# Patient Record
Sex: Female | Born: 1965 | Race: Black or African American | Hispanic: No | Marital: Married | Smoking: Current every day smoker
Health system: Southern US, Community
[De-identification: ages and names within clinical notes are randomized; demographics above are authoritative.]

## PROBLEM LIST (undated history)

## (undated) DIAGNOSIS — E119 Type 2 diabetes mellitus without complications: Secondary | ICD-10-CM

## (undated) DIAGNOSIS — G5600 Carpal tunnel syndrome, unspecified upper limb: Secondary | ICD-10-CM

## (undated) DIAGNOSIS — G4730 Sleep apnea, unspecified: Secondary | ICD-10-CM

## (undated) DIAGNOSIS — F329 Major depressive disorder, single episode, unspecified: Secondary | ICD-10-CM

## (undated) DIAGNOSIS — E669 Obesity, unspecified: Secondary | ICD-10-CM

## (undated) DIAGNOSIS — I10 Essential (primary) hypertension: Secondary | ICD-10-CM

## (undated) DIAGNOSIS — F32A Depression, unspecified: Secondary | ICD-10-CM

## (undated) HISTORY — PX: LAPAROSCOPIC UNILATERAL SALPINGO OOPHERECTOMY: SHX5935

---

## 2006-05-12 ENCOUNTER — Ambulatory Visit: Payer: Self-pay | Admitting: General Practice

## 2006-07-05 ENCOUNTER — Ambulatory Visit: Payer: Self-pay | Admitting: Internal Medicine

## 2006-12-06 ENCOUNTER — Ambulatory Visit: Payer: Self-pay | Admitting: Internal Medicine

## 2007-02-07 ENCOUNTER — Other Ambulatory Visit: Payer: Self-pay

## 2007-02-07 ENCOUNTER — Emergency Department: Payer: Self-pay | Admitting: Emergency Medicine

## 2007-04-17 ENCOUNTER — Ambulatory Visit: Payer: Self-pay | Admitting: Internal Medicine

## 2007-04-29 ENCOUNTER — Ambulatory Visit: Payer: Self-pay | Admitting: Family Medicine

## 2007-12-10 ENCOUNTER — Ambulatory Visit: Payer: Self-pay | Admitting: Internal Medicine

## 2008-03-28 ENCOUNTER — Ambulatory Visit: Payer: Self-pay | Admitting: Family Medicine

## 2009-09-20 ENCOUNTER — Emergency Department: Payer: Self-pay | Admitting: Emergency Medicine

## 2009-09-22 ENCOUNTER — Emergency Department: Payer: Self-pay | Admitting: Emergency Medicine

## 2010-09-29 DIAGNOSIS — L732 Hidradenitis suppurativa: Secondary | ICD-10-CM | POA: Insufficient documentation

## 2010-11-02 DIAGNOSIS — G5603 Carpal tunnel syndrome, bilateral upper limbs: Secondary | ICD-10-CM | POA: Insufficient documentation

## 2011-04-27 DIAGNOSIS — H401190 Primary open-angle glaucoma, unspecified eye, stage unspecified: Secondary | ICD-10-CM | POA: Insufficient documentation

## 2012-08-04 ENCOUNTER — Ambulatory Visit: Payer: Self-pay | Admitting: Family Medicine

## 2012-08-04 LAB — URINALYSIS, COMPLETE
Glucose,UR: 1000 mg/dL (ref 0–75)
Ketone: NEGATIVE
Leukocyte Esterase: NEGATIVE
Ph: 5 (ref 4.5–8.0)
Specific Gravity: 1.025 (ref 1.003–1.030)

## 2012-08-04 LAB — CBC WITH DIFFERENTIAL/PLATELET
Basophil #: 0.1 10*3/uL (ref 0.0–0.1)
Eosinophil #: 0.1 10*3/uL (ref 0.0–0.7)
HCT: 38.5 % (ref 35.0–47.0)
Lymphocyte #: 2.3 10*3/uL (ref 1.0–3.6)
Lymphocyte %: 32.8 %
Monocyte #: 0.3 x10 3/mm (ref 0.2–0.9)
Monocyte %: 4.7 %
Neutrophil %: 60 %
Platelet: 224 10*3/uL (ref 150–440)
WBC: 6.9 10*3/uL (ref 3.6–11.0)

## 2012-08-04 LAB — COMPREHENSIVE METABOLIC PANEL
Albumin: 3.6 g/dL (ref 3.4–5.0)
Alkaline Phosphatase: 77 U/L (ref 50–136)
Bilirubin,Total: 0.1 mg/dL — ABNORMAL LOW (ref 0.2–1.0)
Chloride: 98 mmol/L (ref 98–107)
Co2: 29 mmol/L (ref 21–32)
EGFR (Non-African Amer.): >60
Glucose: 247 mg/dL — ABNORMAL HIGH (ref 65–99)
Osmolality: 273 (ref 275–301)
SGOT(AST): 13 U/L — ABNORMAL LOW (ref 15–37)
Sodium: 133 mmol/L — ABNORMAL LOW (ref 136–145)

## 2012-08-04 LAB — MONONUCLEOSIS SCREEN: Mono Test: NEGATIVE

## 2012-10-15 DIAGNOSIS — I10 Essential (primary) hypertension: Secondary | ICD-10-CM | POA: Insufficient documentation

## 2013-02-20 DIAGNOSIS — G4733 Obstructive sleep apnea (adult) (pediatric): Secondary | ICD-10-CM | POA: Insufficient documentation

## 2013-03-27 DIAGNOSIS — M79609 Pain in unspecified limb: Secondary | ICD-10-CM | POA: Insufficient documentation

## 2013-04-17 DIAGNOSIS — Z720 Tobacco use: Secondary | ICD-10-CM | POA: Insufficient documentation

## 2013-09-24 DIAGNOSIS — F32A Depression, unspecified: Secondary | ICD-10-CM | POA: Insufficient documentation

## 2013-09-24 DIAGNOSIS — F329 Major depressive disorder, single episode, unspecified: Secondary | ICD-10-CM | POA: Insufficient documentation

## 2013-12-03 DIAGNOSIS — E1143 Type 2 diabetes mellitus with diabetic autonomic (poly)neuropathy: Secondary | ICD-10-CM | POA: Insufficient documentation

## 2014-11-03 ENCOUNTER — Ambulatory Visit
Admission: EM | Admit: 2014-11-03 | Discharge: 2014-11-03 | Disposition: A | Discharge status: Home / Self Care | Payer: Medicare PPO | Attending: Family Medicine | Admitting: Family Medicine

## 2014-11-03 DIAGNOSIS — N951 Menopausal and female climacteric states: Secondary | ICD-10-CM

## 2014-11-03 DIAGNOSIS — H6593 Unspecified nonsuppurative otitis media, bilateral: Secondary | ICD-10-CM

## 2014-11-03 DIAGNOSIS — M62838 Other muscle spasm: Secondary | ICD-10-CM | POA: Diagnosis not present

## 2014-11-03 DIAGNOSIS — J0100 Acute maxillary sinusitis, unspecified: Secondary | ICD-10-CM | POA: Diagnosis not present

## 2014-11-03 DIAGNOSIS — R609 Edema, unspecified: Secondary | ICD-10-CM

## 2014-11-03 DIAGNOSIS — J209 Acute bronchitis, unspecified: Secondary | ICD-10-CM

## 2014-11-03 HISTORY — DX: Type 2 diabetes mellitus without complications: E11.9

## 2014-11-03 HISTORY — DX: Carpal tunnel syndrome, unspecified upper limb: G56.00

## 2014-11-03 HISTORY — DX: Major depressive disorder, single episode, unspecified: F32.9

## 2014-11-03 HISTORY — DX: Sleep apnea, unspecified: G47.30

## 2014-11-03 HISTORY — DX: Obesity, unspecified: E66.9

## 2014-11-03 HISTORY — DX: Essential (primary) hypertension: I10

## 2014-11-03 HISTORY — DX: Depression, unspecified: F32.A

## 2014-11-03 MED ORDER — FLUTICASONE PROPIONATE 50 MCG/ACT NA SUSP: 1.0000 | Freq: Two times a day (BID) | NASAL | Status: DC

## 2014-11-03 MED ORDER — CARISOPRODOL 350 MG PO TABS: 350.0000 mg | ORAL_TABLET | Freq: Four times a day (QID) | ORAL | Status: DC | PRN

## 2014-11-03 MED ORDER — ACETAMINOPHEN 500 MG PO TABS: 1000.0000 mg | ORAL_TABLET | Freq: Four times a day (QID) | ORAL | Status: AC | PRN

## 2014-11-03 NOTE — Discharge Instructions (Signed)
Heat Therapy Heat therapy can help make painful, stiff muscles and joints feel better. Do not use heat on new injuries. Wait at least 48 hours after an injury to use heat. Do not use heat when you have aches or pains right after an activity. If you still have pain 3 hours after stopping the activity, then you may use heat. HOME CARE Wet heat pack  Soak a clean towel in warm water. Squeeze out the extra water.  Put the warm, wet towel in a plastic bag.  Place a thin, dry towel between your skin and the bag.  Put the heat pack on the area for 5 minutes, and check your skin. Your skin may be pink, but it should not be red.  Leave the heat pack on the area for 15 to 30 minutes.  Repeat this every 2 to 4 hours while awake. Do not use heat while you are sleeping. Warm water bath  Fill a tub with warm water.  Place the affected body part in the tub.  Soak the area for 20 to 40 minutes.  Repeat as needed. Hot water bottle  Fill the water bottle half full with hot water.  Press out the extra air. Close the cap tightly.  Place a dry towel between your skin and the bottle.  Put the bottle on the area for 5 minutes, and check your skin. Your skin may be pink, but it should not be red.  Leave the bottle on the area for 15 to 30 minutes.  Repeat this every 2 to 4 hours while awake. Electric heating pad  Place a dry towel between your skin and the heating pad.  Set the heating pad on low heat.  Put the heating pad on the area for 10 minutes, and check your skin. Your skin may be pink, but it should not be red.  Leave the heating pad on the area for 20 to 40 minutes.  Repeat this every 2 to 4 hours while awake.  Do not lie on the heating pad.  Do not fall asleep while using the heating pad.  Do not use the heating pad near water. GET HELP RIGHT AWAY IF:  You get blisters or red skin.  Your skin is puffy (swollen), or you lose feeling (numbness) in the affected area.  You  have any new problems.  Your problems are getting worse.  You have any questions or concerns. If you have any problems, stop using heat therapy until you see your doctor. MAKE SURE YOU:  Understand these instructions.  Will watch your condition.  Will get help right away if you are not doing well or get worse. Document Released: 04/11/2011 Document Reviewed: 03/12/2013 [facility] Patient Information 2015 [place]. This information is not intended to replace advice given to you by your health care provider. Make sure you discuss any questions you have with your health care provider.  Edema Edema is an abnormal buildup of fluids in your bodytissues. Edema is somewhatdependent on gravity to pull the fluid to the lowest place in your body. That makes the condition more common in the legs and thighs (lower extremities). Painless swelling of the feet and ankles is common and becomes more likely as you get older. It is also common in looser tissues, like around your eyes.  When the affected area is squeezed, the fluid may move out of that spot and leave a dent for a few moments. This dent is called pitting.  CAUSES  There are many possible causes of edema. Eating too much salt and being on your feet or sitting for a long time can cause edema in your legs and ankles. Hot weather may make edema worse. Common medical causes of edema include:  Heart failure.  Liver disease.  Kidney disease.  Weak blood vessels in your legs.  Cancer.  An injury.  Pregnancy.  Some medications.  Obesity. SYMPTOMS  Edema is usually painless.Your skin may look swollen or shiny.  DIAGNOSIS  Your health care provider may be able to diagnose edema by asking about your medical history and doing a physical exam. You may need to have tests such as X-rays, an electrocardiogram, or blood tests to check for medical conditions that may cause edema.  TREATMENT  Edema treatment depends on the cause. If you  have heart, liver, or kidney disease, you need the treatment appropriate for these conditions. General treatment may include:  Elevation of the affected body part above the level of your heart.  Compression of the affected body part. Pressure from elastic bandages or support stockings squeezes the tissues and forces fluid back into the blood vessels. This keeps fluid from entering the tissues.  Restriction of fluid and salt intake.  Use of a water pill (diuretic). These medications are appropriate only for some types of edema. They pull fluid out of your body and make you urinate more often. This gets rid of fluid and reduces swelling, but diuretics can have side effects. Only use diuretics as directed by your health care provider. HOME CARE INSTRUCTIONS   Keep the affected body part above the level of your heart when you are lying down.   Do not sit still or stand for prolonged periods.   Do not put anything directly under your knees when lying down.  Do not wear constricting clothing or garters on your upper legs.   Exercise your legs to work the fluid back into your blood vessels. This may help the swelling go down.   Wear elastic bandages or support stockings to reduce ankle swelling as directed by your health care provider.   Eat a low-salt diet to reduce fluid if your health care provider recommends it.   Only take medicines as directed by your health care provider. SEEK MEDICAL CARE IF:   Your edema is not responding to treatment.  You have heart, liver, or kidney disease and notice symptoms of edema.  You have edema in your legs that does not improve after elevating them.   You have sudden and unexplained weight gain. SEEK IMMEDIATE MEDICAL CARE IF:   You develop shortness of breath or chest pain.   You cannot breathe when you lie down.  You develop pain, redness, or warmth in the swollen areas.   You have heart, liver, or kidney disease and suddenly get  edema.  You have a fever and your symptoms suddenly get worse. MAKE SURE YOU:   Understand these instructions.  Will watch your condition.  Will get help right away if you are not doing well or get worse. Document Released: 01/17/2005 Document Revised: 06/03/2013 Document Reviewed: 11/09/2012 [facility] Patient Information 2015 [place], [place]. This information is not intended to replace advice given to you by your health care provider. Make sure you discuss any questions you have with your health care provider. Basic Carbohydrate Counting for Diabetes Mellitus Carbohydrate counting is a method for keeping track of the amount of carbohydrates you eat. Eating carbohydrates naturally increases the level of sugar (  glucose) in your blood, so it is important for you to know the amount that is okay for you to have in every meal. Carbohydrate counting helps keep the level of glucose in your blood within normal limits. The amount of carbohydrates allowed is different for every person. A dietitian can help you calculate the amount that is right for you. Once you know the amount of carbohydrates you can have, you can count the carbohydrates in the foods you want to eat. Carbohydrates are found in the following foods:  Grains, such as breads and cereals.  Dried beans and soy products.  Starchy vegetables, such as potatoes, peas, and corn.  Fruit and fruit juices.  Milk and yogurt.  Sweets and snack foods, such as cake, cookies, candy, chips, soft drinks, and fruit drinks. CARBOHYDRATE COUNTING There are two ways to count the carbohydrates in your food. You can use either of the methods or a combination of both. Reading the "Nutrition Facts" on [place] The "Nutrition Facts" is an area that is included on the labels of almost all packaged food and beverages in the [place]. It includes the serving size of that food or beverage and information about the nutrients in each serving of the food,  including the grams (g) of carbohydrate per serving.  Decide the number of servings of this food or beverage that you will be able to eat or drink. Multiply that number of servings by the number of grams of carbohydrate that is listed on the label for that serving. The total will be the amount of carbohydrates you will be having when you eat or drink this food or beverage. Learning Standard Serving Sizes of Food When you eat food that is not packaged or does not include "Nutrition Facts" on the label, you need to measure the servings in order to count the amount of carbohydrates.A serving of most carbohydrate-rich foods contains about 15 g of carbohydrates. The following list includes serving sizes of carbohydrate-rich foods that provide 15 g ofcarbohydrate per serving:   1 slice of bread (1 oz) or 1 six-inch tortilla.    of a hamburger bun or English muffin.  4-6 crackers.   cup unsweetened dry cereal.    cup hot cereal.   cup rice or pasta.    cup mashed potatoes or  of a large baked potato.  1 cup fresh fruit or one small piece of fruit.    cup canned or frozen fruit or fruit juice.  1 cup milk.   cup plain fat-free yogurt or yogurt sweetened with artificial sweeteners.   cup cooked dried beans or starchy vegetable, such as peas, corn, or potatoes.  Decide the number of standard-size servings that you will eat. Multiply that number of servings by 15 (the grams of carbohydrates in that serving). For example, if you eat 2 cups of strawberries, you will have eaten 2 servings and 30 g of carbohydrates (2 servings x 15 g = 30 g). For foods such as soups and casseroles, in which more than one food is mixed in, you will need to count the carbohydrates in each food that is included. EXAMPLE OF CARBOHYDRATE COUNTING Sample Dinner  3 oz chicken breast.   cup of brown rice.   cup of corn.  1 cup milk.   1 cup strawberries with sugar-free whipped topping.   Carbohydrate Calculation Step 1: Identify the foods that contain carbohydrates:   Rice.   Corn.   Milk.   Strawberries. Step 2:Calculate  the number of servings eaten of each:   2 servings of rice.   1 serving of corn.   1 serving of milk.   1 serving of strawberries. Step 3: Multiply each of those number of servings by 15 g:   2 servings of rice x 15 g = 30 g.   1 serving of corn x 15 g = 15 g.   1 serving of milk x 15 g = 15 g.   1 serving of strawberries x 15 g = 15 g. Step 4: Add together all of the amounts to find the total grams of carbohydrates eaten: 30 g + 15 g + 15 g + 15 g = 75 g. Document Released: 01/17/2005 Document Revised: 06/03/2013 Document Reviewed: 12/14/2012 [facility] Patient Information 2015 [place], [place]. This information is not intended to replace advice given to you by your health care provider. Make sure you discuss any questions you have with your health care provider. Shoulder Range of Motion Exercises The shoulder is the most flexible joint in the human body. Because of this it is also the most unstable joint in the body. All ages can develop shoulder problems. Early treatment of problems is necessary for a good outcome. People react to shoulder pain by decreasing the movement of the joint. After a brief period of time, the shoulder can become "frozen". This is an almost complete loss of the ability to move the damaged shoulder. Following injuries your caregivers can give you instructions on exercises to keep your range of motion (ability to move your shoulder freely), or regain it if it has been lost.  [place]: Codman's Exercise or Pendulum Exercise  This exercise may be performed in a prone (face-down) lying position or standing while leaning on a chair with the opposite arm. Its purpose is to relax the muscles in your shoulder and slowly but surely increase the range of motion and  to relieve pain.  Lie on your stomach close to the side edge of the bed. Let your weak arm hang over the edge of the bed. Relax your shoulder, arm and hand. Let your shoulder blade relax and drop down.  Slowly and gently swing your arm forward and back. Do not use your neck muscles; relax them. It might be easier to have someone else gently start swinging your arm.  As pain decreases, increase your swing. To start, arm swing should begin at 15 degree angles. In time and as pain lessens, move to 30-45 degree angles. Start with swinging for about 15 seconds, and work towards swinging for 3 to 5 minutes.  This exercise may also be performed in a standing/bent over position.  Stand and hold onto a sturdy chair with your good arm. Bend forward at the waist and bend your knees slightly to help protect your back. Relax your weak arm, let it hang limp. Relax your shoulder blade and let it drop.  Keep your shoulder relaxed and use body motion to swing your arm in small circles.  Stand up tall and relax.  Repeat motion and change direction of circles.  Start with swinging for about 30 seconds, and work towards swinging for 3 to 5 minutes. STRETCHING EXERCISES:  Lift your arm out in front of you with the elbow bent at 90 degrees. Using your other arm gently pull the elbow forward and across your body.  Bend one arm behind you with the palm facing outward. Using the other arm,  hold a towel or rope and reach this arm up above your head, then bend it at the elbow to move your wrist to behind your neck. Grab the free end of the towel with the hand behind your back. Gently pull the towel up with the hand behind your neck, gradually increasing the pull on the hand behind the small of your back. Then, gradually pull down with the hand behind the small of your back. This will pull the hand and arm behind your neck further. Both shoulders will have an increased range of motion with repetition of this  exercise. STRENGTHENING EXERCISES:  Standing with your arm at your side and straight out from your shoulder with the elbow bent at 90 degrees, hold onto a small weight and slowly raise your hand so it points straight up in the air. Repeat this five times to begin with, and gradually increase to ten times. Do this four times per day. As you grow stronger you can gradually increase the weight.  Repeat the above exercise, only this time using an elastic band. Start with your hand up in the air and pull down until your hand is by your side. As you grow stronger, gradually increase the amount you pull by increasing the number or size of the elastic bands. Use the same amount of repetitions.  Standing with your hand at your side and holding onto a weight, gradually lift the hand in front of you until it is over your head. Do the same also with the hand remaining at your side and lift the hand away from your body until it is again over your head. Repeat this five times to begin with, and gradually increase to ten times. Do this four times per day. As you grow stronger you can gradually increase the weight. Document Released: 10/16/2002 Document Revised: 01/22/2013 Document Reviewed: 01/17/2005 [facility] Patient Information 2015 [place], [place]. This information is not intended to replace advice given to you by your health care provider. Make sure you discuss any questions you have with your health care provider. Muscle Cramps and Spasms Muscle cramps and spasms occur when a muscle or muscles tighten and you have no control over this tightening (involuntary muscle contraction). They are a common problem and can develop in any muscle. The most common place is in the calf muscles of the leg. Both muscle cramps and muscle spasms are involuntary muscle contractions, but they also have differences:   Muscle cramps are sporadic and painful. They may last a few seconds to a quarter of an hour. Muscle cramps are often  more forceful and last longer than muscle spasms.  Muscle spasms may or may not be painful. They may also last just a few seconds or much longer. CAUSES  It is uncommon for cramps or spasms to be due to a serious underlying problem. In many cases, the cause of cramps or spasms is unknown. Some common causes are:   Overexertion.   Overuse from repetitive motions (doing the same thing over and over).   Remaining in a certain position for a long period of time.   Improper preparation, form, or technique while performing a sport or activity.   Dehydration.   Injury.   Side effects of some medicines.   Abnormally low levels of the salts and ions in your blood (electrolytes), especially potassium and calcium. This could happen if you are taking water pills (diuretics) or you are pregnant.  Some underlying medical problems can make it more  likely to develop cramps or spasms. These include, but are not limited to:   Diabetes.   Parkinson disease.   Hormone disorders, such as thyroid problems.   Alcohol abuse.   Diseases specific to muscles, joints, and bones.   Blood vessel disease where not enough blood is getting to the muscles.  HOME CARE INSTRUCTIONS   Stay well hydrated. Drink enough water and fluids to keep your urine clear or pale yellow.  It may be helpful to massage, stretch, and relax the affected muscle.  For tight or tense muscles, use a warm towel, heating pad, or hot shower water directed to the affected area.  If you are sore or have pain after a cramp or spasm, applying ice to the affected area may relieve discomfort.  Put ice in a plastic bag.  Place a towel between your skin and the bag.  Leave the ice on for 15-20 minutes, 03-04 times a day.  Medicines used to treat a known cause of cramps or spasms may help reduce their frequency or severity. Only take over-the-counter or prescription medicines as directed by your caregiver. SEEK MEDICAL  CARE IF:  Your cramps or spasms get more severe, more frequent, or do not improve over time.  MAKE SURE YOU:   Understand these instructions.  Will watch your condition.  Will get help right away if you are not doing well or get worse. Document Released: 07/09/2001 Document Revised: 05/14/2012 Document Reviewed: 01/04/2012 [facility] Patient Information 2015 [place], [place]. This information is not intended to replace advice given to you by your health care provider. Make sure you discuss any questions you have with your health care provider.

## 2014-11-03 NOTE — ED Notes (Signed)
X 2 days located BLE, hands. Pt reports her pain is so bad that she took Aleve 1000 mg, Tylenol 1000 mg, and Lyrica 200 mg at the same time this afternoon. Pt reports no relief. Pain is located in arms and is an "achy pain". The pain is 10/10.

## 2014-11-03 NOTE — ED Provider Notes (Addendum)
CSN: [id number]     Arrival date & time 11/03/14  1913 History   First MD Initiated Contact with Patient 11/03/14 1959     Chief Complaint  Patient presents with  . Edema  . Generalized Body Aches   (Consider location/radiation/quality/duration/timing/severity/associated sxs/prior Treatment) HPI Comments: Single african [place] female here with boyfriend for evaluation of bilateral leg edema; bilateral shoulder pain x 3 days and no menses this month.  Has been taking tylenol, advil, aleve po prn.  Has not taken her victoza injections nor does she test her blood sugars at home.  Victoza makes her feel bad like she is having low blood sugars but when she checked it wasn't low on her machine.  Has not been using her cpap this week for OSA either.  Smoker.  Has been wheezing after smoking, intermittent cough, nasal congestion right nare.  Had heart stress test performed last month told things looked good.  Diabetic neuropathy bilateral lower legs.  Vitamin D deficiency.  Intermittent headache.  Patient has had tubal ligation wondering if she is in menopause.  Mother had hysterectomy at young age and she is oldest child in family.  Chronic anemia on iron therapy.  Labs performed by PCM 1 month ago.  HgbA1c 10.7  The history is provided by the patient and a friend.    Past Medical History  Diagnosis Date  . Diabetes mellitus without complication ([place])   . Hypertension   . Sleep apnea   . Depression   . Obesity   . Carpal tunnel syndrome    Past Surgical History  Procedure Laterality Date  . Cesarean section  x 4  . Laparoscopic unilateral salpingo oopherectomy Right    No family history on file. Social History  Substance Use Topics  . Smoking status: Current Every Day Smoker -- 1.00 packs/day  . Smokeless tobacco: Never Used  . Alcohol Use: No   OB History    No data available     Review of Systems  Constitutional: Negative for fever, chills, diaphoresis, activity change, appetite  change and fatigue.  HENT: Positive for congestion. Negative for dental problem, drooling, ear discharge, ear pain, facial swelling, hearing loss, mouth sores, nosebleeds, postnasal drip, rhinorrhea, sinus pressure, sneezing, sore throat, tinnitus, trouble swallowing and voice change.   Eyes: Negative for photophobia, pain, discharge, redness, itching and visual disturbance.  Respiratory: Positive for cough and wheezing. Negative for choking, chest tightness, shortness of breath and stridor.   Cardiovascular: Positive for leg swelling. Negative for chest pain and palpitations.  Gastrointestinal: Negative for nausea, vomiting, abdominal pain, diarrhea, constipation, blood in stool, abdominal distention, anal bleeding and rectal pain.  Endocrine: Negative for cold intolerance and heat intolerance.  Genitourinary: Positive for menstrual problem. Negative for dysuria, urgency, frequency, hematuria, flank pain, decreased urine volume, vaginal bleeding, vaginal discharge, enuresis, difficulty urinating, genital sores, vaginal pain and pelvic pain.  Musculoskeletal: Positive for myalgias and arthralgias. Negative for back pain, joint swelling, gait problem, neck pain and neck stiffness.  Skin: Negative for color change, pallor, rash and wound.  Allergic/Immunologic: Negative for environmental allergies and food allergies.  Neurological: Positive for headaches. Negative for dizziness, tremors, seizures, syncope, facial asymmetry, speech difficulty, weakness, light-headedness and numbness.  Hematological: Negative for adenopathy. Does not bruise/bleed easily.  Psychiatric/Behavioral: Negative for behavioral problems, confusion, sleep disturbance and agitation.    Allergies  Clindamycin/lincomycin; Diphenhydramine hcl; Gabapentin; Gentamycin; Ibuprofen; Meperidine and related; Penicillins; Pravastatin; Propranolol; and Simvastatin  Home Medications   Prior to Admission  medications   Medication Sig Start  Date End Date Taking? Authorizing Provider  aspirin 81 MG tablet Take 81 mg by mouth daily.   Yes Historical Provider, MD  buPROPion (WELLBUTRIN SR) 150 MG 12 hr tablet Take 150 mg by mouth 2 (two) times daily.   Yes Historical Provider, MD  diclofenac sodium (VOLTAREN) 1 % GEL Apply topically 4 (four) times daily.   Yes Historical Provider, MD  DULoxetine (CYMBALTA) 60 MG capsule Take 60 mg by mouth daily.   Yes Historical Provider, MD  ferrous sulfate 325 (65 FE) MG tablet Take 325 mg by mouth daily with breakfast.   Yes Historical Provider, MD  glucose blood test strip 1 each by Other route as needed for other. Use as instructed   Yes Historical Provider, MD  hydrochlorothiazide (HYDRODIURIL) 50 MG tablet Take 50 mg by mouth daily.   Yes Historical Provider, MD  insulin glargine (LANTUS) 100 UNIT/ML injection Inject into the skin at bedtime.   Yes Historical Provider, MD  latanoprost (XALATAN) 0.005 % ophthalmic solution 1 drop at bedtime.   Yes Historical Provider, MD  Liraglutide (VICTOZA) 18 MG/3ML SOPN Inject into the skin.   Yes Historical Provider, MD  losartan (COZAAR) 100 MG tablet Take 100 mg by mouth daily.   Yes Historical Provider, MD  metFORMIN (GLUCOPHAGE) 1000 MG tablet Take 1,000 mg by mouth 2 (two) times daily with a meal.   Yes Historical Provider, MD  polyethylene glycol (MIRALAX / GLYCOLAX) packet Take 17 g by mouth daily.   Yes Historical Provider, MD  pregabalin (LYRICA) 200 MG capsule Take 200 mg by mouth 2 (two) times daily.   Yes Historical Provider, MD  tranexamic acid (LYSTEDA) 650 MG TABS tablet Take 1,300 mg by mouth 3 (three) times daily.   Yes Historical Provider, MD  acetaminophen (TYLENOL) 500 MG tablet Take 2 tablets (1,000 mg total) by mouth every 6 (six) hours as needed for moderate pain or fever. 11/03/14   Olen Cordial, NP  carisoprodol (SOMA) 350 MG tablet Take 1 tablet (350 mg total) by mouth 4 (four) times daily as needed for muscle spasms. 11/03/14    Olen Cordial, NP  fluticasone (FLONASE) 50 MCG/ACT nasal spray Place 1 spray into both nostrils 2 (two) times daily. 11/03/14   Olen Cordial, NP   Meds Ordered and Administered this Visit  Medications - No data to display  BP 162/77 mmHg  Pulse 98  Temp(Src) 98.3 F (36.8 C) (Oral)  Resp 16  Ht 5\' 3"  (1.6 m)  Wt 238 lb (107.956 kg)  BMI 42.17 kg/m2  SpO2 99%  LMP  (LMP Unknown) No data found.   Physical Exam  Constitutional: She is oriented to person, place, and time. Vital signs are normal. She appears well-developed and well-nourished. No distress.  HENT:  Head: Normocephalic and atraumatic.  Right Ear: Hearing, external ear and ear canal normal. A middle ear effusion is present.  Left Ear: Hearing, external ear and ear canal normal. A middle ear effusion is present.  Nose: Mucosal edema and rhinorrhea present. No nose lacerations, sinus tenderness, nasal deformity, septal deviation or nasal septal hematoma. No epistaxis.  No foreign bodies. Right sinus exhibits maxillary sinus tenderness. Right sinus exhibits no frontal sinus tenderness. Left sinus exhibits maxillary sinus tenderness. Left sinus exhibits no frontal sinus tenderness.  Mouth/Throat: Uvula is midline and mucous membranes are normal. She does not have dentures. No oral lesions. No trismus in the jaw. Normal dentition. No dental  abscesses, uvula swelling, lacerations or dental caries. Posterior oropharyngeal edema and posterior oropharyngeal erythema present. No oropharyngeal exudate or tonsillar abscesses.  Bilateral TMs with air fluid level; bilateral nasal turbinates with edema/erythema; cobblestoning posterior pharynx; bilateral maxillary sinuses TTP  Eyes: Conjunctivae, EOM and lids are normal. Pupils are equal, round, and reactive to light. Right eye exhibits no discharge. Left eye exhibits no discharge. No scleral icterus.  Neck: Trachea normal. Neck supple. Muscular tenderness present. No tracheal  tenderness and no spinous process tenderness present. No rigidity. Decreased range of motion present. No tracheal deviation, no edema and no erythema present. No thyroid mass and no thyromegaly present.  Cardiovascular: Normal rate, regular rhythm, S1 normal, S2 normal, normal heart sounds and intact distal pulses.  Exam reveals no gallop and no friction rub.   No murmur heard. Pulses:      Radial pulses are 2+ on the right side, and 2+ on the left side.       Dorsalis pedis pulses are 2+ on the right side, and 2+ on the left side.  Pulmonary/Chest: Effort normal. No accessory muscle usage or stridor. No respiratory distress. She has no decreased breath sounds. She has no wheezes. She has rhonchi in the right middle field and the left middle field. She has no rales.  Rhonchi cleared with nonproductive coughing during exam  Abdominal: Soft. Bowel sounds are normal. She exhibits no shifting dullness, no distension, no pulsatile liver, no fluid wave, no abdominal bruit, no ascites, no pulsatile midline mass and no mass. There is no hepatosplenomegaly. There is no tenderness. There is no rigidity, no rebound, no guarding, no tenderness at McBurney's point and negative Murphy's sign.  Musculoskeletal: She exhibits edema and tenderness.       Right shoulder: She exhibits decreased range of motion, tenderness, pain and spasm. She exhibits no bony tenderness, no swelling, no effusion, no crepitus, no deformity, no laceration, normal pulse and normal strength.       Left shoulder: She exhibits decreased range of motion, tenderness, pain and spasm. She exhibits no bony tenderness, no swelling, no effusion, no crepitus, no deformity, no laceration, normal pulse and normal strength.       Right elbow: Normal.      Left elbow: Normal.       Right wrist: Normal.       Left wrist: Normal.       Right hip: Normal.       Left hip: Normal.       Right knee: Normal.       Left knee: Normal.       Right ankle: She  exhibits swelling. She exhibits normal range of motion, no ecchymosis, no deformity, no laceration and normal pulse. Tenderness. Achilles tendon normal.       Left ankle: She exhibits swelling. She exhibits normal range of motion, no ecchymosis, no deformity, no laceration and normal pulse. Tenderness. Achilles tendon normal.       Cervical back: She exhibits tenderness, pain and spasm. She exhibits normal range of motion, no bony tenderness, no swelling, no edema, no deformity, no laceration and normal pulse.       Thoracic back: Normal.       Lumbar back: Normal.       Right upper arm: She exhibits tenderness. She exhibits no bony tenderness, no swelling, no edema, no deformity and no laceration.       Left upper arm: She exhibits tenderness. She exhibits no bony tenderness, no swelling,  no edema, no deformity and no laceration.       Right forearm: Normal.       Left forearm: Normal.       Right hand: Normal.       Left hand: Normal.       Right lower leg: She exhibits tenderness, swelling and edema. She exhibits no bony tenderness, no deformity and no laceration.       Left lower leg: She exhibits tenderness, swelling and edema. She exhibits no bony tenderness, no deformity and no laceration.  Bilateral trapezius muscles taut/spasms; pain bilateral shoulders/proximal biceps tendon/muscles TTP pain worsens with reaching behind back + atchley scratch, pain worsens with external rotation shoulder no crepitus or defect palpated/heard; bilateral lower extremities below knees to ankles with pitting edema 1-2+/4 TTP skin warm dry and pink ambulates without difficulty in exam room normal heel toe gait transfer from chair to exam table  Lymphadenopathy:    She has no cervical adenopathy.  Neurological: She is alert and oriented to person, place, and time. She displays no atrophy, no tremor and normal reflexes. A sensory deficit is present. No cranial nerve deficit. She exhibits normal muscle tone. She  displays no seizure activity. Coordination and gait normal. GCS eye subscore is 4. GCS verbal subscore is 5. GCS motor subscore is 6.  Reflex Scores:      Patellar reflexes are 2+ on the right side and 2+ on the left side. 5/5 extremity strength equal bilaterally; bilateral lower extremity diabetic neuropathy altered sensation  Skin: Skin is warm, dry and intact. No abrasion, no bruising, no burn, no ecchymosis, no laceration, no lesion, no petechiae and no rash noted. She is not diaphoretic. No cyanosis or erythema. No pallor. Nails show no clubbing.  Psychiatric: She has a normal mood and affect. Her speech is normal and behavior is normal. Judgment and thought content normal. Cognition and memory are normal.  Nursing note and vitals reviewed.   ED Course  Procedures (including critical care time)  Labs Review Labs Reviewed - No data to display  Imaging Review No results found.    MDM   1. Muscle spasm of right shoulder   2. Muscle spasm of left shoulder   3. Pitting edema   4. Acute maxillary sinusitis, recurrence not specified   5. Otitis media with effusion, bilateral   6. Acute bronchitis, unspecified organism   7. Perimenopause   Plan: 1. Test/x-ray results and diagnosis reviewed with patient 2. rx as per orders above;  benefits, risks, potential side effects reviewed  3. Recommend supportive treatment with tylenol, heat/ice, gentle AROM/stretches, humidifier, elevating legs 4. Follow up prn if symptoms worsen or are not improving  For acute pain, rest, and intermittent application of heat (do not sleep on heating pad).  I discussed longer term treatment plan of acetaminophen 1000mg  po QID and soma 350mg  po QID prn. Slow position changes soma may cause drowsiness, low blood presssure, dizzyness.  Avoid driving and alcohol intake.  Discussed advil/aleve/naproxen/motrin counteract her blood pressure medication and I did not recommend them at this time and to follow up with her  PCM within 48 hours for repeat evaluation of blood pressure and possibly labs/imaging if worsening/no improvement of symptoms.  Demonstrated chest stretches, shoulder AROM, neck circles and I discussed a home neck/shoulder care exercise program with a flexion exercise routine exitcare handout on shoulder exercises, muscle spasms given to patient..  Proper avoidance of heavy lifting discussed.  Consider physical therapy and additional radiology  if not improving.   Patient verbalized agreement and understanding of treatment plan and had no further questions at this time P2:  Injury Prevention, fitness  Stop naproxen/advil/aleve.  May take tylenol 1000mg  po QID prn pain.  Hx anemia on iron therapy.  Patient to follow medication administration per PCM.  Restart monitoring blood sugars and blood pressures at home and maintain log of each and bring to [facility] appt.  Restart her victoza per PCM instructions.  Elevate legs when sitting.  Consider TED hose.  Continue monitoring daily weights and call PCM if greater than 1 lb weight gain per 24 hours or dyspnea.  Continue low added sodium diet and exercise program.  Recommended weight loss/weight maintenance to BMI 20-25.  Return to the clinic if any new symptoms. exitcare handout on edema given to patient. Patient verbalized agreement and understanding of treatment plan and had no further questions at this time.   P2:  Diet and Exercise specific for HTN  Start flonase 1 spray each nostril BID.  No evidence of systemic bacterial infection, non toxic and well hydrated.  I do not see where any further testing or imaging is necessary at this time.   I will suggest supportive care, rest, good hygiene and encourage the patient to take adequate fluids.  The patient is to return to clinic or EMERGENCY ROOM if symptoms worsen or change significantly.  Exitcare handout on sinusitis given to patient.  Patient verbalized agreement and understanding of treatment plan and had no further  questions at this time.   P2:  Hand washing and cover cough  Supportive treatment.   No evidence of invasive bacterial infection, non toxic and well hydrated.  This is most likely self limiting viral infection.  I do not see where any further testing or imaging is necessary at this time.   I will suggest supportive care, rest, good hygiene and encourage the patient to take adequate fluids.  The patient is to return to clinic or EMERGENCY ROOM if symptoms worsen or change significantly e.g. ear pain, fever, purulent discharge from ears or bleeding.  Exitcare handout on otitis media with effusion given to patient.  Patient verbalized agreement and understanding of treatment plan.    Stop or cut down smoking.  Shower prior to bedtime to clear pollens and dust off skin.  Humidifier in bedroom at night.   Afebrile sp02 99% room air Bronchitis simple, community acquired, may have started as viral (probably respiratory syncytial, parainfluenza, influenza, or adenovirus), but now evidence of acute purulent bronchitis with resultant bronchial edema and mucus formation.  Viruses are the most common cause of bronchial inflammation in otherwise healthy adults with acute bronchitis.  The appearance of sputum is not predictive of whether a bacterial infection is present.  Purulent sputum is most often caused by viral infections.  There are a small portion of those caused by non-viral agents being Mycoplamsa pneumonia.  Microscopic examination or C&S of sputum in the healthy adult with acute bronchitis is generally not helpful (usually negative or normal respiratory flora) other considerations being cough from upper respiratory tract infections, sinusitis or allergic syndromes (mild asthma or viral pneumonia).  Differential Diagnosis:  reactive airway disease (asthma, allergic aspergillosis (eosinophilia), chronic bronchitis, respiratory infection (Sinusitis, Common cold, pneumonia), congestive heart failure, reflux  esophagitis, bronchogenic tumor, aspiration syndromes and/or exposure irritants/tobacco smoke.  In this case, there is no evidence of any invasive bacterial illness.  Most likely viral etiology so will hold on antibiotic treatment.  Diabetic  holding on prednisone.  Advise supportive care with rest, encourage fluids, good hygiene and watch for any worsening symptoms.  If they were to develop:  come back to the office or go to the emergency room if after hours. Without high fever, severe dyspnea, lack of physical findings or other risk factors, I will hold on a chest radiograph and CBC at this time. I discussed that approximately 50% of patients with acute bronchitis have a cough that lasts up to three weeks, and 25% for over a month.  Tylenol, one to two tablets every four hours as needed for fever or myalgias.   No aspirin.  Patient instructed to follow up in one week or sooner if symptoms worsen. Patient verbalized agreement and understanding of treatment plan.  P2:  hand washing and cover cough  Discussed with patient she could be in start of menopause.  Menopause official when one year has passed without menses.  There is no blood test or imaging to verify today if in menopause.  Also discussed with patient dysmenorrhea may occur when anemic and/or diabetes is not well controlled and she is to follow up with PCM within the next 48 hours for re-evaluation.  Patient with history of bilateral tubal ligation.  Patient verbalized understanding of information/instructions, agreed with plan of care and had no further questions at this time.  Olen Cordial, NP 11/05/14 1820  07 Nov 2014 2001 Attempted to speak with patient via telephone no answer no voicemail. 08 Nov 2014 0935 Spoke with patient via telephone saw [facility] coworker NP yesterday to follow up urgent care visit.  Still has cough not worsening.  Leg swelling resolved after stopping aleve.  Soma helped with muscle spasms/cramps and NP started her on  tizanadine yesterday (refilled).  Labs drawn per patient.  Feeling much better and will follow up with her PCM in one month--sooner if worsening symptoms as scheduled.  Patient verbalized understanding of information/instructions, agreed with plan of care and had no further questions at this time.  Olen Cordial, NP 11/08/14 [phone]

## 2014-11-21 ENCOUNTER — Ambulatory Visit
Admission: EM | Admit: 2014-11-21 | Discharge: 2014-11-21 | Disposition: A | Discharge status: Home / Self Care | Payer: Medicare PPO | Attending: Emergency Medicine | Admitting: Emergency Medicine

## 2014-11-21 DIAGNOSIS — N939 Abnormal uterine and vaginal bleeding, unspecified: Secondary | ICD-10-CM

## 2014-11-21 LAB — WET PREP, GENITAL
Clue Cells Wet Prep HPF POC: NONE SEEN
TRICH WET PREP: NONE SEEN
WBC, Wet Prep HPF POC: NONE SEEN
YEAST WET PREP: NONE SEEN

## 2014-11-21 LAB — CBC WITH DIFFERENTIAL/PLATELET
BASOS ABS: 0.1 10*3/uL (ref 0–0.1)
EOS ABS: 0.1 10*3/uL (ref 0–0.7)
HCT: 36.7 % (ref 35.0–47.0)
Hemoglobin: 11 g/dL — ABNORMAL LOW (ref 12.0–16.0)
Lymphs Abs: 2.1 10*3/uL (ref 1.0–3.6)
MCH: 18.8 pg — ABNORMAL LOW (ref 26.0–34.0)
MCHC: 29.9 g/dL — ABNORMAL LOW (ref 32.0–36.0)
MCV: 63 fL — AB (ref 80.0–100.0)
MONO ABS: 0.5 10*3/uL (ref 0.2–0.9)
Monocytes Relative: 5 %
Neutro Abs: 8.7 10*3/uL — ABNORMAL HIGH (ref 1.4–6.5)
Neutrophils Relative %: 75 %
PLATELETS: 345 10*3/uL (ref 150–440)
RBC: 5.83 MIL/uL — AB (ref 3.80–5.20)
RDW: 24.6 % — AB (ref 11.5–14.5)
WBC: 11.5 10*3/uL — AB (ref 3.6–11.0)

## 2014-11-21 LAB — BASIC METABOLIC PANEL
ANION GAP: 10 (ref 5–15)
BUN: 14 mg/dL (ref 6–20)
CALCIUM: 8.9 mg/dL (ref 8.9–10.3)
CO2: 26 mmol/L (ref 22–32)
Chloride: 101 mmol/L (ref 101–111)
Creatinine, Ser: 0.58 mg/dL (ref 0.44–1.00)
Glucose, Bld: 161 mg/dL — ABNORMAL HIGH (ref 65–99)
POTASSIUM: 3.6 mmol/L (ref 3.5–5.1)
SODIUM: 137 mmol/L (ref 135–145)

## 2014-11-21 LAB — HCG, QUANTITATIVE, PREGNANCY

## 2014-11-21 LAB — PREGNANCY, URINE: PREG TEST UR: NEGATIVE

## 2014-11-21 NOTE — ED Notes (Signed)
No period for 2 months.  Early this morning was spotting.  By 10 was gone.  Around 1130 felt like had blood everywhere "I thought I was peeing on myself".   Reports having a "depends" full of "clots".   Had history of vag hemorrhage and infusions.    Just feels tired.

## 2014-11-21 NOTE — ED Notes (Signed)
Ambulatory slowly to treatment room.  No issues with gait though.

## 2014-11-21 NOTE — Discharge Instructions (Signed)
Go to the ER if you pass out if you feel like you are about to pass out, chest pain, shortness of breath. Follow-up with your OB/GYN. You may try 800 mg ibuprofen 3 times a day if it does not give you a severe headache. this will help slow down the bleeding. Continue the iron pills.

## 2014-11-21 NOTE — ED Provider Notes (Signed)
HPI  SUBJECTIVE:  Rebecca Whitaker is a 49 y.o. female who presents with vaginal bleeding with clots starting today. States that she filled 3 depends diapers. She reports fatigue, lower abdominal pain described as constant soreness. There are no aggravating or alleviating factors. She has not tried anything for this. She has not had menses in 2 months. No nausea, vomiting, fevers, presyncope, syncope, chest pain, shortness of breath, vaginal discharge, vaginal odor. Last intercourse was 2 days ago, patient states that she had no problems with this. She has an OB GYN follow-up on November 4 for the amenorrhea. Past medical history of uterine hemorrhaging requiring transfusion 2, anemia, diabetes, hypertension, tachycardia, states that her baseline is in the 110s, uterine fibroids. States that she takes a baby aspirin daily. No history of coagulopathies, other anticoagulant or antiplatelet use. No history of gonorrhea as a teen. Also history of yeast infections. No history of  chlamydia, herpes, HIV, syphilis, Trichomonas, BV. She is in a monogamous relationship with a female partner, who is asymptomatic. STDs are not a concern today.    Past Medical History  Diagnosis Date  . Diabetes mellitus without complication ([place])   . Hypertension   . Sleep apnea   . Depression   . Obesity   . Carpal tunnel syndrome     Past Surgical History  Procedure Laterality Date  . Cesarean section  x 4  . Laparoscopic unilateral salpingo oopherectomy Right     History reviewed. No pertinent family history.  Social History  Substance Use Topics  . Smoking status: Current Every Day Smoker -- 1.00 packs/day  . Smokeless tobacco: Never Used  . Alcohol Use: No    No current facility-administered medications for this encounter.  Current outpatient prescriptions:  .  acetaminophen (TYLENOL) 500 MG tablet, Take 2 tablets (1,000 mg total) by mouth every 6 (six) hours as needed for moderate pain or fever.,  Disp: 30 tablet, Rfl: 0 .  aspirin 81 MG tablet, Take 81 mg by mouth daily., Disp: , Rfl:  .  buPROPion (WELLBUTRIN SR) 150 MG 12 hr tablet, Take 150 mg by mouth 2 (two) times daily., Disp: , Rfl:  .  diclofenac sodium (VOLTAREN) 1 % GEL, Apply topically 4 (four) times daily., Disp: , Rfl:  .  DULoxetine (CYMBALTA) 60 MG capsule, Take 60 mg by mouth daily., Disp: , Rfl:  .  ferrous sulfate 325 (65 FE) MG tablet, Take 325 mg by mouth daily with breakfast., Disp: , Rfl:  .  fluticasone (FLONASE) 50 MCG/ACT nasal spray, Place 1 spray into both nostrils 2 (two) times daily., Disp: 16 g, Rfl: 2 .  glucose blood test strip, 1 each by Other route as needed for other. Use as instructed, Disp: , Rfl:  .  hydrochlorothiazide (HYDRODIURIL) 50 MG tablet, Take 50 mg by mouth daily., Disp: , Rfl:  .  insulin glargine (LANTUS) 100 UNIT/ML injection, Inject into the skin at bedtime., Disp: , Rfl:  .  latanoprost (XALATAN) 0.005 % ophthalmic solution, 1 drop at bedtime., Disp: , Rfl:  .  Liraglutide (VICTOZA) 18 MG/3ML SOPN, Inject into the skin., Disp: , Rfl:  .  losartan (COZAAR) 100 MG tablet, Take 100 mg by mouth daily., Disp: , Rfl:  .  metFORMIN (GLUCOPHAGE) 1000 MG tablet, Take 1,000 mg by mouth 2 (two) times daily with a meal., Disp: , Rfl:  .  polyethylene glycol (MIRALAX / GLYCOLAX) packet, Take 17 g by mouth daily., Disp: , Rfl:  .  pregabalin (  LYRICA) 200 MG capsule, Take 200 mg by mouth 2 (two) times daily., Disp: , Rfl:  .  tranexamic acid (LYSTEDA) 650 MG TABS tablet, Take 1,300 mg by mouth 3 (three) times daily., Disp: , Rfl:   Allergies  Allergen Reactions  . Clindamycin/Lincomycin   . Diphenhydramine Hcl   . Gabapentin   . Gentamycin [Gentamicin]   . Ibuprofen   . Meperidine And Related   . Penicillins   . Pravastatin   . Propranolol   . Simvastatin      ROS  As noted in HPI.   Physical Exam  BP 126/94 mmHg  Pulse 112  Temp(Src) 97.5 F (36.4 C) (Tympanic)  Resp 16  SpO2  100%  LMP  (LMP Unknown)  Constitutional: Well developed, well nourished, no acute distress. Obese.  Eyes: PERRL, EOMI, conjunctiva normal bilaterally HENT: Normocephalic, atraumatic,mucus membranes moist Respiratory: Clear to auscultation bilaterally, no rales, no wheezing, no rhonchi Cardiovascular: Normal rate and rhythm, no murmurs, no gallops, no rubs GI: Soft, nondistended, normal bowel sounds, midline lower abdominal tenderness no rebound, no guarding Back: no CVAT GU: Normal external genitalia, unable to completely visualize os. + bleeding with clots. Tender uterus. No palpable uterine fibroids, no CMT, no adnexal tenderness. Chaperone present during exam skin: No rash, skin intact Musculoskeletal: No edema, no tenderness, no deformities Neurologic: Alert & oriented x 3, CN II-XII grossly intact, no motor deficits, sensation grossly intact Psychiatric: Speech and behavior appropriate   ED Course   Medications - No data to display  Orders Placed This Encounter  Procedures  . Wet prep, genital    Standing Status: Standing     Number of Occurrences: 1     Standing Expiration Date:   . CBC with Differential    Standing Status: Standing     Number of Occurrences: 1     Standing Expiration Date:   . Basic metabolic panel    Standing Status: Standing     Number of Occurrences: 1     Standing Expiration Date:   . Pregnancy, urine    Standing Status: Standing     Number of Occurrences: 1     Standing Expiration Date:   . hCG, quantitative, pregnancy    Standing Status: Standing     Number of Occurrences: 1     Standing Expiration Date:    Results for orders placed or performed during the hospital encounter of 11/21/14 (from the past 24 hour(s))  CBC with Differential     Status: Abnormal   Collection Time: 11/21/14  7:29 PM  Result Value Ref Range   WBC 11.5 (H) 3.6 - 11.0 K/uL   RBC 5.83 (H) 3.80 - 5.20 MIL/uL   Hemoglobin 11.0 (L) 12.0 - 16.0 g/dL   HCT 36.7 35.0  - 47.0 %   MCV 63.0 (L) 80.0 - 100.0 fL   MCH 18.8 (L) 26.0 - 34.0 pg   MCHC 29.9 (L) 32.0 - 36.0 g/dL   RDW 24.6 (H) 11.5 - 14.5 %   Platelets 345 150 - 440 K/uL   Neutrophils Relative % 75% %   Neutro Abs 8.7 (H) 1.4 - 6.5 K/uL   Lymphocytes Relative 18% %   Lymphs Abs 2.1 1.0 - 3.6 K/uL   Monocytes Relative 5% %   Monocytes Absolute 0.5 0.2 - 0.9 K/uL   Eosinophils Relative 1% %   Eosinophils Absolute 0.1 0 - 0.7 K/uL   Basophils Relative 1% %   Basophils Absolute 0.1 0 -  0.1 K/uL  Basic metabolic panel     Status: Abnormal   Collection Time: 11/21/14  7:29 PM  Result Value Ref Range   Sodium 137 135 - 145 mmol/L   Potassium 3.6 3.5 - 5.1 mmol/L   Chloride 101 101 - 111 mmol/L   CO2 26 22 - 32 mmol/L   Glucose, Bld 161 (H) 65 - 99 mg/dL   BUN 14 6 - 20 mg/dL   Creatinine, Ser 0.58 0.44 - 1.00 mg/dL   Calcium 8.9 8.9 - 10.3 mg/dL   GFR calc non Af Amer >60 >60 mL/min   GFR calc Af Amer >60 >60 mL/min   Anion gap 10 5 - 15  Pregnancy, urine     Status: None   Collection Time: 11/21/14  7:29 PM  Result Value Ref Range   Preg Test, Ur NEGATIVE NEGATIVE  hCG, quantitative, pregnancy     Status: None   Collection Time: 11/21/14  7:29 PM  Result Value Ref Range   hCG, Beta Chain, Quant, S <1 <5 mIU/mL  Wet prep, genital     Status: None   Collection Time: 11/21/14  8:20 PM  Result Value Ref Range   Yeast Wet Prep HPF POC NONE SEEN NONE SEEN   Trich, Wet Prep NONE SEEN NONE SEEN   Clue Cells Wet Prep HPF POC NONE SEEN NONE SEEN   WBC, Wet Prep HPF POC NONE SEEN NONE SEEN   No results found.  ED Clinical Impression  Abnormal uterine bleeding  ED Assessment/Plan  Wet prep negative. Urine pregnancy negative. Hemoglobin 11, previous hemoglobin 12.7 which was from 07/2012. Tachycardia noted, patient states is her baseline. Offered prescription of high-dose NSAIDs, patient states that naprosyn interacts with a HCTZ and ibuprofen gives her headache.. Patient is already on  oral iron.  She'll follow-up with her OB/GYN as scheduled on November 4. Discussed labs, medical decision-making, signs and symptoms that should prompt return to the ER to the urgent care. Patient agrees with plan.  *This clinic note was created using Dragon dictation software. Therefore, there may be occasional mistakes despite careful proofreading.  ?  Melynda Ripple, MD 11/21/14 2203

## 2015-02-01 ENCOUNTER — Ambulatory Visit: Admission: EM | Admit: 2015-02-01 | Discharge: 2015-02-01 | Discharge status: Absent without leave | Payer: Medicare PPO

## 2015-07-21 DIAGNOSIS — D252 Subserosal leiomyoma of uterus: Secondary | ICD-10-CM | POA: Insufficient documentation

## 2015-09-21 ENCOUNTER — Ambulatory Visit
Admission: EM | Admit: 2015-09-21 | Discharge: 2015-09-21 | Disposition: A | Discharge status: Home / Self Care | Payer: Medicare Other | Attending: Family Medicine | Admitting: Family Medicine

## 2015-09-21 DIAGNOSIS — R5383 Other fatigue: Secondary | ICD-10-CM | POA: Diagnosis present

## 2015-09-21 DIAGNOSIS — I10 Essential (primary) hypertension: Secondary | ICD-10-CM | POA: Diagnosis not present

## 2015-09-21 DIAGNOSIS — Z794 Long term (current) use of insulin: Secondary | ICD-10-CM | POA: Insufficient documentation

## 2015-09-21 DIAGNOSIS — G4730 Sleep apnea, unspecified: Secondary | ICD-10-CM | POA: Insufficient documentation

## 2015-09-21 DIAGNOSIS — F17210 Nicotine dependence, cigarettes, uncomplicated: Secondary | ICD-10-CM | POA: Diagnosis not present

## 2015-09-21 DIAGNOSIS — Z79899 Other long term (current) drug therapy: Secondary | ICD-10-CM | POA: Insufficient documentation

## 2015-09-21 DIAGNOSIS — Z7982 Long term (current) use of aspirin: Secondary | ICD-10-CM | POA: Diagnosis not present

## 2015-09-21 DIAGNOSIS — Z6839 Body mass index (BMI) 39.0-39.9, adult: Secondary | ICD-10-CM | POA: Diagnosis not present

## 2015-09-21 DIAGNOSIS — E669 Obesity, unspecified: Secondary | ICD-10-CM | POA: Insufficient documentation

## 2015-09-21 DIAGNOSIS — E119 Type 2 diabetes mellitus without complications: Secondary | ICD-10-CM | POA: Insufficient documentation

## 2015-09-21 LAB — COMPREHENSIVE METABOLIC PANEL
ALT: 17 U/L (ref 14–54)
AST: 17 U/L (ref 15–41)
Albumin: 3.8 g/dL (ref 3.5–5.0)
Alkaline Phosphatase: 73 U/L (ref 38–126)
Anion gap: 9 (ref 5–15)
BILIRUBIN TOTAL: 0.5 mg/dL (ref 0.3–1.2)
BUN: 20 mg/dL (ref 6–20)
CHLORIDE: 97 mmol/L — AB (ref 101–111)
CO2: 28 mmol/L (ref 22–32)
Calcium: 8.5 mg/dL — ABNORMAL LOW (ref 8.9–10.3)
Creatinine, Ser: 0.97 mg/dL (ref 0.44–1.00)
Glucose, Bld: 342 mg/dL — ABNORMAL HIGH (ref 65–99)
POTASSIUM: 3.7 mmol/L (ref 3.5–5.1)
Sodium: 134 mmol/L — ABNORMAL LOW (ref 135–145)
TOTAL PROTEIN: 7.6 g/dL (ref 6.5–8.1)

## 2015-09-21 LAB — CBC WITH DIFFERENTIAL/PLATELET
BASOS ABS: 0.1 10*3/uL (ref 0–0.1)
Basophils Relative: 1 %
EOS ABS: 0.1 10*3/uL (ref 0–0.7)
EOS PCT: 1 %
HCT: 42.8 % (ref 35.0–47.0)
HEMOGLOBIN: 14.3 g/dL (ref 12.0–16.0)
LYMPHS ABS: 1.8 10*3/uL (ref 1.0–3.6)
LYMPHS PCT: 22 %
MCH: 28 pg (ref 26.0–34.0)
MCHC: 33.3 g/dL (ref 32.0–36.0)
MCV: 84 fL (ref 80.0–100.0)
Monocytes Absolute: 0.5 10*3/uL (ref 0.2–0.9)
Monocytes Relative: 6 %
NEUTROS PCT: 70 %
Neutro Abs: 5.8 10*3/uL (ref 1.4–6.5)
PLATELETS: 310 10*3/uL (ref 150–440)
RBC: 5.1 MIL/uL (ref 3.80–5.20)
RDW: 15 % — ABNORMAL HIGH (ref 11.5–14.5)
WBC: 8.3 10*3/uL (ref 3.6–11.0)

## 2015-09-21 LAB — HCG, QUANTITATIVE, PREGNANCY

## 2015-09-21 NOTE — ED Triage Notes (Signed)
Patient complains of weakness, shakiness, she has been menstruating for 10 days. Hx of anemia. Provera was prescribed by her OB/GYN and she has been taking it since March. She hasn't had a period since March and has a plan to have a hysterectomy when her blood glucose stabilizes.

## 2015-09-21 NOTE — Discharge Instructions (Signed)
FOLLOW UP WITH YOUR OB/GYN AND YOUR PRIMARY PROVIDER FOR RECHECK

## 2015-10-29 ENCOUNTER — Ambulatory Visit
Admission: EM | Admit: 2015-10-29 | Discharge: 2015-10-29 | Disposition: A | Discharge status: Home / Self Care | Payer: Medicare Other | Attending: Family Medicine | Admitting: Family Medicine

## 2015-10-29 ENCOUNTER — Encounter: Payer: Self-pay | Admitting: *Deleted

## 2015-10-29 DIAGNOSIS — J0110 Acute frontal sinusitis, unspecified: Secondary | ICD-10-CM

## 2015-10-29 DIAGNOSIS — J40 Bronchitis, not specified as acute or chronic: Secondary | ICD-10-CM | POA: Diagnosis not present

## 2015-10-29 MED ORDER — DOXYCYCLINE HYCLATE 100 MG PO CAPS: 100.0000 mg | ORAL_CAPSULE | Freq: Two times a day (BID) | ORAL | 0 refills | Status: DC

## 2015-10-29 MED ORDER — BENZONATATE 100 MG PO CAPS: 100.0000 mg | ORAL_CAPSULE | Freq: Three times a day (TID) | ORAL | 0 refills | Status: DC | PRN

## 2015-10-29 MED ORDER — GUAIFENESIN-CODEINE 100-10 MG/5ML PO SOLN: 5.0000 mL | Freq: Every evening | ORAL | 0 refills | Status: DC | PRN

## 2015-10-29 NOTE — Discharge Instructions (Addendum)
Take medication as prescribed. Rest. Drink plenty of fluids.  ° °Follow up with your primary care physician this week as needed. Return to Urgent care for new or worsening concerns.  ° °

## 2015-10-29 NOTE — ED Provider Notes (Signed)
[facility] ____________________________________________  Time seen: Approximately 6:18 PM  I have reviewed the triage vital signs and the nursing notes.   HISTORY  Chief Complaint Cough; Nasal Congestion; and Headache  HPI Rebecca Whitaker is a 50 y.o. female presents for the complaints of 5 days of runny nose, nasal congestion, sinus pressure and cough. Patient states occasional scratchy throat and throat discomfort. Patient reports frequently blowing nose and getting very thick greenish nasal drainage out. Patient reports cough is primarily a dry cough and worse at night with associated postnasal drainage. Patient reports taking over-the-counter DayQuil without much improvement. Patient does report she has had a few family members recently with similar complaints. Denies fevers.   Denies chest pain, shortness of breath, abdominal pain, dysuria, extremity pain, extremity swelling, recent surgeries, recent immobilizations. Reports has continued to eat and drink well.  [facility]  Patient's last menstrual period was 09/15/2015. Reports recent frequent vaginal bleeding and following with OBGYN-states awaiting hysterectomy. Denies chance of pregnancy.    Past Medical History:  Diagnosis Date  . Carpal tunnel syndrome   . Depression   . Diabetes mellitus without complication ([place])   . Hypertension   . Obesity   . Sleep apnea     There are no active problems to display for this patient.   Past Surgical History:  Procedure Laterality Date  . CESAREAN SECTION  x 4  . LAPAROSCOPIC UNILATERAL SALPINGO OOPHERECTOMY Right     Current Outpatient Rx  . Order #: [id number] Class: Normal  . Order #: [id number] Class: Historical Med  . Order #: [id number] Class: Historical Med  . Order #: [id number] Class: Historical Med  . Order #: [id number] Class: Normal  . Order #: [id number] Class: Historical Med  . Order #: [id number] Class: Historical Med  . Order #:  [id number] Class: Historical Med  . Order #: [id number] Class: Historical Med  . Order #: [id number] Class: Historical Med  . Order #: [id number] Class: Historical Med  . Order #: [id number] Class: Historical Med  . Order #: [id number] Class: Normal  . Order #: [id number] Class: Historical Med  . Order #: [id number] Class: Historical Med  . Order #: [id number] Class: Normal  . Order #: [id number] Class: Historical Med  . Order #: [id number] Class: Historical Med  . Order #: [id number] Class: Print  . Order #: [id number] Class: Historical Med  . Order #: [id number] Class: Historical Med  . Order #: [id number] Class: Historical Med  . Order #: [id number] Class: Historical Med  . Order #: [id number] Class: Historical Med  . Order #: [id number] Class: Historical Med  . Order #: [id number] Class: Historical Med  . Order #: [id number] Class: Historical Med    No current facility-administered medications for this encounter.   Current Outpatient Prescriptions:  .  acetaminophen (TYLENOL) 500 MG tablet, Take 2 tablets (1,000 mg total) by mouth every 6 (six) hours as needed for moderate pain or fever., Disp: 30 tablet, Rfl: 0 .  aspirin 81 MG tablet, Take 81 mg by mouth daily., Disp: , Rfl:  .  Canagliflozin-Metformin HCl 50-500 MG TABS, Take by mouth., Disp: , Rfl:  .  cyclobenzaprine (FLEXERIL) 10 MG tablet, Take 10 mg by mouth 3 (three) times daily as needed for muscle spasms., Disp: , Rfl:  .  fluticasone (FLONASE) 50 MCG/ACT nasal spray, Place 1 spray into both nostrils 2 (two) times daily., Disp: 16 g, Rfl: 2 .  glucose blood test strip, 1 each by Other route as needed for other. Use as instructed, Disp: , Rfl:  .  hydrochlorothiazide (HYDRODIURIL) 50 MG tablet, Take 50 mg by  mouth daily., Disp: , Rfl:  .  insulin glargine (LANTUS) 100 UNIT/ML injection, Inject into the skin at bedtime., Disp: , Rfl:  .  losartan (COZAAR) 100 MG tablet, Take 100 mg by mouth daily., Disp: , Rfl:  .  Multiple Vitamins-Minerals (MULTIVITAMIN  PO), Take by mouth., Disp: , Rfl:  .  ranitidine (ZANTAC) 150 MG tablet, Take 150 mg by mouth 2 (two) times daily., Disp: , Rfl:  .  venlafaxine XR (EFFEXOR-XR) 75 MG 24 hr capsule, Take 75 mg by mouth daily with breakfast., Disp: , Rfl:  .  benzonatate (TESSALON PERLES) 100 MG capsule, Take 1 capsule (100 mg total) by mouth 3 (three) times daily as needed for cough., Disp: 15 capsule, Rfl: 0 .  buPROPion (WELLBUTRIN SR) 150 MG 12 hr tablet, Take 150 mg by mouth 2 (two) times daily., Disp: , Rfl:  .  diclofenac sodium (VOLTAREN) 1 % GEL, Apply topically 4 (four) times daily., Disp: , Rfl:  .  doxycycline (VIBRAMYCIN) 100 MG capsule, Take 1 capsule (100 mg total) by mouth 2 (two) times daily., Disp: 20 capsule, Rfl: 0 .  DULoxetine (CYMBALTA) 60 MG capsule, Take 60 mg by mouth daily., Disp: , Rfl:  .  ferrous sulfate 325 (65 FE) MG tablet, Take 325 mg by mouth daily with breakfast., Disp: , Rfl:  .  guaiFENesin-codeine 100-10 MG/5ML syrup, Take 5 mLs by mouth at bedtime as needed for cough. Do not drive or operate machinery while taking as can cause drowsiness., Disp: 75 mL, Rfl: 0 .  latanoprost (XALATAN) 0.005 % ophthalmic solution, 1 drop at bedtime., Disp: , Rfl:  .  Liraglutide (VICTOZA) 18 MG/3ML SOPN, Inject into the skin., Disp: , Rfl:  .  medroxyPROGESTERone (PROVERA) 10 MG tablet, Take 20 mg by mouth daily., Disp: , Rfl:  .  metFORMIN (GLUCOPHAGE) 1000 MG tablet, Take 1,000 mg by mouth 2 (two) times daily with a meal., Disp: , Rfl:  .  oxyCODONE-acetaminophen (PERCOCET/ROXICET) 5-325 MG tablet, Take 1 tablet by mouth as needed for severe pain., Disp: , Rfl:  .  polyethylene glycol (MIRALAX / GLYCOLAX) packet, Take 17 g by mouth daily., Disp: , Rfl:  .  pregabalin (LYRICA) 200 MG capsule, Take 200 mg by mouth 2 (two) times daily., Disp: , Rfl:  .  tranexamic acid (LYSTEDA) 650 MG TABS tablet, Take 1,300 mg by mouth 3 (three) times daily., Disp: , Rfl:   Allergies Clindamycin/lincomycin;  Diphenhydramine hcl; Gabapentin; Gentamycin [gentamicin]; Ibuprofen; Meperidine and related; Penicillins; Pravastatin; Propranolol; and Simvastatin  History reviewed. No pertinent family history.  Social History Social History  Substance Use Topics  . Smoking status: Current Every Day Smoker    Packs/day: 1.00  . Smokeless tobacco: Never Used  . Alcohol use No    Review of Systems Constitutional: No fever/chills Eyes: No visual changes. ENT: as above. Cardiovascular: Denies chest pain. Respiratory: Denies shortness of breath. Gastrointestinal: No abdominal pain.  No nausea, no vomiting.  No diarrhea.  No constipation. Genitourinary: Negative for dysuria. Musculoskeletal: Negative for back pain. Skin: Negative for rash. Neurological: Negative for headaches, focal weakness or numbness.  10-point ROS otherwise negative.  ____________________________________________   PHYSICAL EXAM:  VITAL SIGNS: ED Triage Vitals  Enc Vitals Group     BP 10/29/15 1750 121/71     Pulse Rate 10/29/15 1750 (!) 115 Recheck 88     Resp 10/29/15 1750 20     Temp 10/29/15 1750 98.5 F (36.9 C)     Temp Source  10/29/15 1750 Oral     SpO2 10/29/15 1750 97 %     Weight 10/29/15 1752 223 lb (101.2 kg)     Height 10/29/15 1752 5\' 3"  (1.6 m)     Head Circumference --      Peak Flow --      Pain Score 10/29/15 1804 0     Pain Loc --      Pain Edu? --      Excl. in [place]? --    Constitutional: Alert and oriented. Well appearing and in no acute distress. Eyes: Conjunctivae are normal. PERRL. EOMI. Head: Atraumatic.Mild to moderate tenderness to palpation bilateral frontal and nontender maxillary sinuses. No swelling. No erythema.   Ears: no erythema, normal TMs bilaterally.   Nose: nasal congestion with bilateral nasal turbinate erythema and edema.   Mouth/Throat: Mucous membranes are moist.  Oropharynx non-erythematous.No tonsillar swelling or exudate.  Neck: No stridor.  No cervical spine  tenderness to palpation. Hematological/Lymphatic/Immunilogical: No cervical lymphadenopathy. Cardiovascular: Normal rate, regular rhythm. Grossly normal heart sounds.  Good peripheral circulation. Respiratory: Normal respiratory effort.  No retractions. Lungs CTAB. No wheezes, rales or rhonchi. Good air movement. Dry intermittent cough noted in room.  Gastrointestinal: Soft and nontender. No distention. Normal Bowel sounds. No CVA tenderness. Musculoskeletal: No lower or upper extremity tenderness nor edema.  Bilateral pedal pulses equal and easily palpated. No cervical, thoracic or lumbar tenderness to palpation.  Neurologic:  Normal speech and language. No gross focal neurologic deficits are appreciated. No gait instability. Skin:  Skin is warm, dry and intact. No rash noted. Psychiatric: Mood and affect are normal. Speech and behavior are normal.  ___________________________________________   LABS (all labs ordered are listed, but only abnormal results are displayed)  Labs Reviewed - No data to display   PROCEDURES Procedures    INITIAL IMPRESSION / ASSESSMENT AND PLAN / ED COURSE  Pertinent labs & imaging results that were available during my care of the patient were reviewed by me and considered in my medical decision making (see chart for details).  Well-appearing patient. No acute distress. Multiple comorbidities.Suspect frontal sinusitis and bronchitis. Will treat patient with oral doxycycline as patient reports has tolerated well the past. Declines chance of pregnancy. Encouraged supportive care. When necessary Tessalon Perles and when necessary guaifenesin with codeine at night, patient reports she has also tolerated his medications well and the past. Encourage rest, fluids and PCP follow-up as needed.Discussed indication, risks and benefits of medications with patient.  Discussed follow up with Primary care physician this week. Discussed follow up and return parameters  including no resolution or any worsening concerns. Patient verbalized understanding and agreed to plan.   ____________________________________________   FINAL CLINICAL IMPRESSION(S) / ED DIAGNOSES  Final diagnoses:  Acute frontal sinusitis, recurrence not specified  Bronchitis     Discharge Medication List as of 10/29/2015  6:40 PM    START taking these medications   Details  benzonatate (TESSALON PERLES) 100 MG capsule Take 1 capsule (100 mg total) by mouth 3 (three) times daily as needed for cough., Starting Thu 10/29/2015, Normal    doxycycline (VIBRAMYCIN) 100 MG capsule Take 1 capsule (100 mg total) by mouth 2 (two) times daily., Starting Thu 10/29/2015, Normal    guaiFENesin-codeine 100-10 MG/5ML syrup Take 5 mLs by mouth at bedtime as needed for cough. Do not drive or operate machinery while taking as can cause drowsiness., Starting Thu 10/29/2015, Print        Note: This dictation was prepared  with Dragon dictation along with smaller phrase technology. Any transcriptional errors that result from this process are unintentional.    Clinical Course      Marylene Land, NP 10/29/15 2033

## 2015-10-29 NOTE — ED Triage Notes (Signed)
Patient started having symptoms of cough with congestion and then progressed to nasal congestion and drainage.

## 2015-11-10 ENCOUNTER — Ambulatory Visit
Admission: EM | Admit: 2015-11-10 | Discharge: 2015-11-10 | Disposition: A | Discharge status: Home / Self Care | Payer: Medicare Other | Attending: Family Medicine | Admitting: Family Medicine

## 2015-11-10 ENCOUNTER — Encounter: Payer: Self-pay | Admitting: *Deleted

## 2015-11-10 DIAGNOSIS — B0223 Postherpetic polyneuropathy: Secondary | ICD-10-CM | POA: Diagnosis not present

## 2015-11-10 MED ORDER — FAMCICLOVIR 500 MG PO TABS: 500.0000 mg | ORAL_TABLET | Freq: Three times a day (TID) | ORAL | 0 refills | Status: DC

## 2015-11-10 NOTE — ED Provider Notes (Signed)
CSN: [id number]     Arrival date & time 11/10/15  1613 History   First MD Initiated Contact with Patient 11/10/15 1701     Chief Complaint  Patient presents with  . Chest Pain   (Consider location/radiation/quality/duration/timing/severity/associated sxs/prior Treatment) HPI  This a 50 year old female insulin dependent diabetic takes that approximately 2 weeks ago had a burning sensation under her left breast rating around her chest to the posterior left axilla. The pain has worsened over the last few days. States that she has not noticed a rash to this time. She did have chickenpox as a child. She had a recent URI she states that the burning pain was when she had first experienced. He never had shingles before. This affects only the left side in a single dermatome. She's had several people look at the area and states that nobody has seen a rash. It is possible that the rash is about to erupt but this seems like a longer time than normally seen. Is that she still feels very fatigued and out of sorts but does not know if it's from the URI or from her present problem.    Past Medical History:  Diagnosis Date  . Carpal tunnel syndrome   . Depression   . Diabetes mellitus without complication ([place])   . Hypertension   . Obesity   . Sleep apnea    Past Surgical History:  Procedure Laterality Date  . CESAREAN SECTION  x 4  . LAPAROSCOPIC UNILATERAL SALPINGO OOPHERECTOMY Right    History reviewed. No pertinent family history. Social History  Substance Use Topics  . Smoking status: Current Every Day Smoker    Packs/day: 1.00  . Smokeless tobacco: Never Used  . Alcohol use No   OB History    No data available     Review of Systems  Constitutional: Positive for activity change. Negative for appetite change, chills, fatigue and fever.  All other systems reviewed and are negative.   Allergies  Clindamycin/lincomycin; Diphenhydramine hcl; Gabapentin; Gentamycin [gentamicin];  Ibuprofen; Meperidine and related; Penicillins; Pravastatin; Propranolol; and Simvastatin  Home Medications   Prior to Admission medications   Medication Sig Start Date End Date Taking? Authorizing Provider  acetaminophen (TYLENOL) 500 MG tablet Take 2 tablets (1,000 mg total) by mouth every 6 (six) hours as needed for moderate pain or fever. 11/03/14  Yes Olen Cordial, NP  aspirin 81 MG tablet Take 81 mg by mouth daily.   Yes Historical Provider, MD  Canagliflozin-Metformin HCl 50-500 MG TABS Take by mouth.   Yes Historical Provider, MD  cyclobenzaprine (FLEXERIL) 10 MG tablet Take 10 mg by mouth 3 (three) times daily as needed for muscle spasms.   Yes Historical Provider, MD  hydrochlorothiazide (HYDRODIURIL) 50 MG tablet Take 50 mg by mouth daily.   Yes Historical Provider, MD  insulin glargine (LANTUS) 100 UNIT/ML injection Inject into the skin at bedtime.   Yes Historical Provider, MD  losartan (COZAAR) 100 MG tablet Take 100 mg by mouth daily.   Yes Historical Provider, MD  ranitidine (ZANTAC) 150 MG tablet Take 150 mg by mouth 2 (two) times daily.   Yes Historical Provider, MD  venlafaxine XR (EFFEXOR-XR) 75 MG 24 hr capsule Take 75 mg by mouth daily with breakfast.   Yes Historical Provider, MD  famciclovir (FAMVIR) 500 MG tablet Take 1 tablet (500 mg total) by mouth 3 (three) times daily. 11/10/15   Lorin Picket, PA-C  fluticasone (FLONASE) 50 MCG/ACT nasal spray Place 1  spray into both nostrils 2 (two) times daily. 11/03/14   Olen Cordial, NP  glucose blood test strip 1 each by Other route as needed for other. Use as instructed    Historical Provider, MD  Multiple Vitamins-Minerals (MULTIVITAMIN PO) Take by mouth.    Historical Provider, MD  oxyCODONE-acetaminophen (PERCOCET/ROXICET) 5-325 MG tablet Take 1 tablet by mouth as needed for severe pain.    Historical Provider, MD   Meds Ordered and Administered this Visit  Medications - No data to display  BP (!) 135/91 (BP  Location: Left Arm)   Pulse (!) 104   Temp 98.9 F (37.2 C) (Oral)   Resp 16   Ht 5\' 3"  (1.6 m)   Wt 227 lb (103 kg)   LMP 09/15/2015   SpO2 98%   BMI 40.21 kg/m  No data found.   Physical Exam  Constitutional: She is oriented to person, place, and time. She appears well-developed and well-nourished. No distress.  HENT:  Head: Normocephalic and atraumatic.  Eyes: EOM are normal. Pupils are equal, round, and reactive to light. Right eye exhibits no discharge. Left eye exhibits no discharge.  Neck: Normal range of motion. Neck supple.  Pulmonary/Chest: Effort normal and breath sounds normal. No respiratory distress. She has no wheezes. She has no rales.  Musculoskeletal: Normal range of motion.  Neurological: She is alert and oriented to person, place, and time.  Skin: Skin is warm and dry. She is not diaphoretic.  Examination of the left chest was accomplished with Lenna Sciara, CMA chaperone. She does not have any obvious rash present. However she does have a papular feeling of roughness along the skin and a dysesthesia to light touch. This is mostly from the anterior to posterior axillary line. No vesicular rash is identified.  Psychiatric: She has a normal mood and affect. Her behavior is normal. Judgment and thought content normal.  Nursing note and vitals reviewed.   Urgent Care Course   Clinical Course    Procedures (including critical care time)  Labs Review Labs Reviewed - No data to display  Imaging Review No results found.   Visual Acuity Review  Right Eye Distance:   Left Eye Distance:   Bilateral Distance:    Right Eye Near:   Left Eye Near:    Bilateral Near:         MDM   1. Shingles (herpes zoster) polyneuropathy    Discharge Medication List as of 11/10/2015  5:51 PM    START taking these medications   Details  famciclovir (FAMVIR) 500 MG tablet Take 1 tablet (500 mg total) by mouth 3 (three) times daily., Starting Tue 11/10/2015, Normal       Plan: 1. Test/x-ray results and diagnosis reviewed with patient 2. rx as per orders; risks, benefits, potential side effects reviewed with patient 3. Recommend supportive treatment with Tylenol for pain. I suspect that the patient has had the rash, come and go already or is about to erupt and she continues to have the postherpetic neuralgia which accounts for her pain. She has no rash at the present time. She still has dysesthesia to light touch in a dermatomal pattern on the left extending from under her breast to the post axillary line. 4. F/u prn if symptoms worsen or don't improve     Lorin Picket, PA-C 11/10/15 1818

## 2015-11-10 NOTE — ED Triage Notes (Signed)
Approx 2 weeks ago pt had a "burning" sensation under left breast radiating around chest to left axilla. Pain is worse over past few days. Denies injury. No rash. Pt has had recent URI with cough but states burning onset was first.

## 2015-11-12 ENCOUNTER — Telehealth: Payer: Self-pay | Admitting: *Deleted

## 2015-11-12 NOTE — Telephone Encounter (Signed)
Called patient, verified DOB, patient reported feeling the same even after taking medication. Advised patient to follow up with PCP or [facility] if symptoms do not improve after finishing course of medication.

## 2015-11-15 NOTE — ED Provider Notes (Signed)
[facility]    CSN: [id number] Arrival date & time: 09/21/15  1830     History   Chief Complaint Chief Complaint  Patient presents with  . Fatigue    HPI Rebecca Whitaker is a 50 y.o. female.   50 yo female, diabetic, complains of weakness, shakiness, she has been menstruating for 10 days. Hx of anemia. Provera was prescribed by her OB/GYN and she has been taking it since March. She hasn't had a period since March and has a plan to have a hysterectomy when her blood glucose stabilizes.         The history is provided by the patient.    Past Medical History:  Diagnosis Date  . Carpal tunnel syndrome   . Depression   . Diabetes mellitus without complication ([place])   . Hypertension   . Obesity   . Sleep apnea     There are no active problems to display for this patient.   Past Surgical History:  Procedure Laterality Date  . CESAREAN SECTION  x 4  . LAPAROSCOPIC UNILATERAL SALPINGO OOPHERECTOMY Right     OB History    No data available       Home Medications    Prior to Admission medications   Medication Sig Start Date End Date Taking? Authorizing Provider  acetaminophen (TYLENOL) 500 MG tablet Take 2 tablets (1,000 mg total) by mouth every 6 (six) hours as needed for moderate pain or fever. 11/03/14  Yes Olen Cordial, NP  aspirin 81 MG tablet Take 81 mg by mouth daily.   Yes Historical Provider, MD  Canagliflozin-Metformin HCl 50-500 MG TABS Take by mouth.   Yes Historical Provider, MD  cyclobenzaprine (FLEXERIL) 10 MG tablet Take 10 mg by mouth 3 (three) times daily as needed for muscle spasms.   Yes Historical Provider, MD  glucose blood test strip 1 each by Other route as needed for other. Use as instructed   Yes Historical Provider, MD  hydrochlorothiazide (HYDRODIURIL) 50 MG tablet Take 50 mg by mouth daily.   Yes Historical Provider, MD  insulin glargine (LANTUS) 100 UNIT/ML injection Inject into the skin at bedtime.   Yes  Historical Provider, MD  losartan (COZAAR) 100 MG tablet Take 100 mg by mouth daily.   Yes Historical Provider, MD  Multiple Vitamins-Minerals (MULTIVITAMIN PO) Take by mouth.   Yes Historical Provider, MD  oxyCODONE-acetaminophen (PERCOCET/ROXICET) 5-325 MG tablet Take 1 tablet by mouth as needed for severe pain.   Yes Historical Provider, MD  ranitidine (ZANTAC) 150 MG tablet Take 150 mg by mouth 2 (two) times daily.   Yes Historical Provider, MD  venlafaxine XR (EFFEXOR-XR) 75 MG 24 hr capsule Take 75 mg by mouth daily with breakfast.   Yes Historical Provider, MD  famciclovir (FAMVIR) 500 MG tablet Take 1 tablet (500 mg total) by mouth 3 (three) times daily. 11/10/15   Lorin Picket, PA-C  fluticasone (FLONASE) 50 MCG/ACT nasal spray Place 1 spray into both nostrils 2 (two) times daily. 11/03/14   Olen Cordial, NP    Family History History reviewed. No pertinent family history.  Social History Social History  Substance Use Topics  . Smoking status: Current Every Day Smoker    Packs/day: 1.00  . Smokeless tobacco: Never Used  . Alcohol use No     Allergies   Clindamycin/lincomycin; Diphenhydramine hcl; Gabapentin; Gentamycin [gentamicin]; Ibuprofen; Meperidine and related; Penicillins; Pravastatin; Propranolol; and Simvastatin   Review of Systems Review of Systems  Physical Exam Triage Vital Signs ED Triage Vitals  Enc Vitals Group     BP 09/21/15 1848 (!) 142/94     Pulse Rate 09/21/15 1848 (!) 114     Resp 09/21/15 1848 18     Temp 09/21/15 1848 98 F (36.7 C)     Temp Source 09/21/15 1848 Oral     SpO2 09/21/15 1848 97 %     Weight 09/21/15 1844 223 lb (101.2 kg)     Height 09/21/15 1844 5\' 3"  (1.6 m)     Head Circumference --      Peak Flow --      Pain Score 09/21/15 1847 0     Pain Loc --      Pain Edu? --      Excl. in [place]? --    No data found.   Updated Vital Signs BP (!) 142/94 (BP Location: Right Arm)   Pulse (!) 108   Temp 98 F (36.7 C)  (Oral)   Resp 18   Ht 5\' 3"  (1.6 m)   Wt 223 lb (101.2 kg)   LMP 09/10/2015   SpO2 97%   BMI 39.50 kg/m   Visual Acuity Right Eye Distance:   Left Eye Distance:   Bilateral Distance:    Right Eye Near:   Left Eye Near:    Bilateral Near:     Physical Exam  Constitutional: She is oriented to person, place, and time. She appears well-developed and well-nourished. No distress.  HENT:  Head: Normocephalic.  Right Ear: Tympanic membrane, external ear and ear canal normal.  Left Ear: Tympanic membrane, external ear and ear canal normal.  Nose: Nose normal.  Mouth/Throat: Oropharynx is clear and moist and mucous membranes are normal.  Eyes: Conjunctivae and EOM are normal. Pupils are equal, round, and reactive to light. Right eye exhibits no discharge. Left eye exhibits no discharge. No scleral icterus.  Neck: Normal range of motion. Neck supple. No JVD present. No tracheal deviation present. No thyromegaly present.  Cardiovascular: Normal rate, regular rhythm, normal heart sounds and intact distal pulses.   No murmur heard. Pulmonary/Chest: Effort normal and breath sounds normal. No stridor. No respiratory distress. She has no wheezes. She has no rales. She exhibits no tenderness.  Abdominal: Soft. Bowel sounds are normal. She exhibits no distension and no mass. There is no tenderness. There is no rebound and no guarding.  Musculoskeletal: She exhibits no edema or tenderness.  Lymphadenopathy:    She has no cervical adenopathy.  Neurological: She is alert and oriented to person, place, and time. She has normal reflexes.  Skin: Skin is warm and dry. No rash noted. She is not diaphoretic. No erythema. No pallor.  Psychiatric: She has a normal mood and affect. Her behavior is normal. Judgment and thought content normal.  Vitals reviewed.    UC Treatments / Results  Labs (all labs ordered are listed, but only abnormal results are displayed) Labs Reviewed  COMPREHENSIVE METABOLIC  PANEL - Abnormal; Notable for the following:       Result Value   Sodium 134 (*)    Chloride 97 (*)    Glucose, Bld 342 (*)    Calcium 8.5 (*)    All other components within normal limits  CBC WITH DIFFERENTIAL/PLATELET - Abnormal; Notable for the following:    RDW 15.0 (*)    All other components within normal limits  HCG, QUANTITATIVE, PREGNANCY    EKG  EKG Interpretation None  Radiology No results found.  Procedures Procedures (including critical care time)  Medications Ordered in UC Medications - No data to display   Initial Impression / Assessment and Plan / UC Course  I have reviewed the triage vital signs and the nursing notes.  Pertinent labs & imaging results that were available during my care of the patient were reviewed by me and considered in my medical decision making (see chart for details).  Clinical Course      Final Clinical Impressions(s) / UC Diagnoses   Final diagnoses:  Other fatigue    New Prescriptions Discharge Medication List as of 09/21/2015  8:03 PM     1. Lab results and diagnosis reviewed with patient 2. rx as per orders above; reviewed possible side effects, interactions, risks and benefits  3. Recommend supportive treatment with increased fluids 4. Follow-up with pcp   Norval Gable, MD 11/15/15 1059

## 2015-12-10 DIAGNOSIS — R200 Anesthesia of skin: Secondary | ICD-10-CM | POA: Insufficient documentation

## 2015-12-10 DIAGNOSIS — R202 Paresthesia of skin: Secondary | ICD-10-CM | POA: Insufficient documentation

## 2016-01-21 DIAGNOSIS — Z8659 Personal history of other mental and behavioral disorders: Secondary | ICD-10-CM | POA: Insufficient documentation

## 2016-02-23 DIAGNOSIS — R102 Pelvic and perineal pain: Secondary | ICD-10-CM | POA: Insufficient documentation

## 2016-04-23 DIAGNOSIS — R200 Anesthesia of skin: Secondary | ICD-10-CM | POA: Insufficient documentation

## 2016-04-23 DIAGNOSIS — M79602 Pain in left arm: Secondary | ICD-10-CM | POA: Insufficient documentation

## 2016-04-23 DIAGNOSIS — G35 Multiple sclerosis: Secondary | ICD-10-CM | POA: Insufficient documentation

## 2016-04-23 DIAGNOSIS — G35D Multiple sclerosis, unspecified: Secondary | ICD-10-CM | POA: Insufficient documentation

## 2016-04-26 ENCOUNTER — Other Ambulatory Visit: Payer: Self-pay | Admitting: Neurology

## 2016-04-26 DIAGNOSIS — R200 Anesthesia of skin: Secondary | ICD-10-CM

## 2016-04-26 DIAGNOSIS — R202 Paresthesia of skin: Principal | ICD-10-CM

## 2016-05-10 ENCOUNTER — Ambulatory Visit
Admission: RE | Admit: 2016-05-10 | Discharge: 2016-05-10 | Disposition: A | Discharge status: Home / Self Care | Payer: Medicare Other | Source: Ambulatory Visit | Attending: Neurology | Admitting: Neurology

## 2016-05-10 DIAGNOSIS — R202 Paresthesia of skin: Principal | ICD-10-CM

## 2016-05-10 DIAGNOSIS — R200 Anesthesia of skin: Secondary | ICD-10-CM

## 2016-05-10 MED ORDER — GADOBENATE DIMEGLUMINE 529 MG/ML IV SOLN: 20.0000 mL | Freq: Once | INTRAVENOUS | Status: DC | PRN

## 2016-05-10 MED ORDER — GADOBENATE DIMEGLUMINE 529 MG/ML IV SOLN
20.0000 mL | Freq: Once | INTRAVENOUS | Status: AC | PRN
  Administered 2016-05-10: 20 mL via INTRAVENOUS

## 2016-06-29 ENCOUNTER — Ambulatory Visit
Admission: EM | Admit: 2016-06-29 | Discharge: 2016-06-29 | Disposition: A | Discharge status: Home / Self Care | Payer: Medicare Other | Attending: Family Medicine | Admitting: Family Medicine

## 2016-06-29 ENCOUNTER — Ambulatory Visit: Payer: Medicare Other

## 2016-06-29 DIAGNOSIS — E119 Type 2 diabetes mellitus without complications: Secondary | ICD-10-CM | POA: Insufficient documentation

## 2016-06-29 DIAGNOSIS — E669 Obesity, unspecified: Secondary | ICD-10-CM | POA: Diagnosis not present

## 2016-06-29 DIAGNOSIS — R200 Anesthesia of skin: Secondary | ICD-10-CM | POA: Diagnosis present

## 2016-06-29 DIAGNOSIS — G4730 Sleep apnea, unspecified: Secondary | ICD-10-CM | POA: Diagnosis not present

## 2016-06-29 DIAGNOSIS — I10 Essential (primary) hypertension: Secondary | ICD-10-CM | POA: Insufficient documentation

## 2016-06-29 DIAGNOSIS — Z79899 Other long term (current) drug therapy: Secondary | ICD-10-CM | POA: Diagnosis not present

## 2016-06-29 DIAGNOSIS — Z7982 Long term (current) use of aspirin: Secondary | ICD-10-CM | POA: Insufficient documentation

## 2016-06-29 DIAGNOSIS — Z880 Allergy status to penicillin: Secondary | ICD-10-CM | POA: Insufficient documentation

## 2016-06-29 DIAGNOSIS — F329 Major depressive disorder, single episode, unspecified: Secondary | ICD-10-CM | POA: Insufficient documentation

## 2016-06-29 DIAGNOSIS — R202 Paresthesia of skin: Secondary | ICD-10-CM | POA: Diagnosis not present

## 2016-06-29 DIAGNOSIS — Z794 Long term (current) use of insulin: Secondary | ICD-10-CM | POA: Diagnosis not present

## 2016-06-29 DIAGNOSIS — F17210 Nicotine dependence, cigarettes, uncomplicated: Secondary | ICD-10-CM | POA: Diagnosis not present

## 2016-06-29 DIAGNOSIS — G5600 Carpal tunnel syndrome, unspecified upper limb: Secondary | ICD-10-CM | POA: Insufficient documentation

## 2016-06-29 MED ORDER — HYDROCODONE-ACETAMINOPHEN 5-325 MG PO TABS: ORAL_TABLET | ORAL | 0 refills | Status: DC

## 2016-06-29 NOTE — Discharge Instructions (Signed)
Follow up with PCP and neurologist for further evaluation and managment

## 2016-06-29 NOTE — ED Provider Notes (Signed)
[facility]    CSN: [id number] Arrival date & time: 06/29/16  1732     History   Chief Complaint Chief Complaint  Patient presents with  . Numbness    HPI Rebecca Whitaker is a 51 y.o. female.   51 yo diabetic female with a c/o approx 8 months h/o left lower chest/upper abdomen numbness/burning pain sensation. States had been diagnosed with shingles in the past, but pain and numbness have continued. States pain also around to the mid back area. Denies any trauma, injury, fevers, chills, cough, rash.     The history is provided by the patient.    Past Medical History:  Diagnosis Date  . Carpal tunnel syndrome   . Depression   . Diabetes mellitus without complication ([place])   . Hypertension   . Obesity   . Sleep apnea     There are no active problems to display for this patient.   Past Surgical History:  Procedure Laterality Date  . CESAREAN SECTION  x 4  . LAPAROSCOPIC UNILATERAL SALPINGO OOPHERECTOMY Right     OB History    No data available       Home Medications    Prior to Admission medications   Medication Sig Start Date End Date Taking? Authorizing Provider  acetaminophen (TYLENOL) 500 MG tablet Take 2 tablets (1,000 mg total) by mouth every 6 (six) hours as needed for moderate pain or fever. 11/03/14  Yes Betancourt, Aura Fey, NP  aspirin 81 MG tablet Take 81 mg by mouth daily.   Yes [provider]  Canagliflozin-Metformin HCl 50-500 MG TABS Take by mouth.   Yes [provider]  cyclobenzaprine (FLEXERIL) 10 MG tablet Take 10 mg by mouth 3 (three) times daily as needed for muscle spasms.   Yes [provider]  fluticasone (FLONASE) 50 MCG/ACT nasal spray Place 1 spray into both nostrils 2 (two) times daily. 11/03/14  Yes Betancourt, Tina A, NP  glucose blood test strip 1 each by Other route as needed for other. Use as instructed   Yes [provider]  hydrochlorothiazide (HYDRODIURIL) 50 MG tablet Take 50  mg by mouth daily.   Yes [provider]  insulin glargine (LANTUS) 100 UNIT/ML injection Inject into the skin at bedtime.   Yes [provider]  latanoprost (XALATAN) 0.005 % ophthalmic solution 1 drop at bedtime.   Yes [provider]  losartan (COZAAR) 100 MG tablet Take 100 mg by mouth daily.   Yes [provider]  pregabalin (LYRICA) 75 MG capsule Take 75 mg by mouth 2 (two) times daily.   Yes [provider]  ranitidine (ZANTAC) 150 MG tablet Take 150 mg by mouth 2 (two) times daily.   Yes [provider]  venlafaxine XR (EFFEXOR-XR) 75 MG 24 hr capsule Take 75 mg by mouth daily with breakfast.   Yes [provider]  famciclovir (FAMVIR) 500 MG tablet Take 1 tablet (500 mg total) by mouth 3 (three) times daily. 11/10/15   Lorin Picket, PA-C  HYDROcodone-acetaminophen (NORCO/VICODIN) 5-325 MG tablet 1-2 tabs po qd prn 06/29/16   Norval Gable, MD  Multiple Vitamins-Minerals (MULTIVITAMIN PO) Take by mouth.    [provider]  oxyCODONE-acetaminophen (PERCOCET/ROXICET) 5-325 MG tablet Take 1 tablet by mouth as needed for severe pain.    [provider]    Family History History reviewed. No pertinent family history.  Social History Social History  Substance Use Topics  . Smoking status: Current  Every Day Smoker    Packs/day: 1.00  . Smokeless tobacco: Never Used  . Alcohol use No     Allergies   Clindamycin/lincomycin; Diphenhydramine hcl; Gabapentin; Gentamycin [gentamicin]; Ibuprofen; Meperidine and related; Penicillins; Pravastatin; Propranolol; and Simvastatin   Review of Systems Review of Systems   Physical Exam Triage Vital Signs ED Triage Vitals  Enc Vitals Group     BP 06/29/16 1902 126/84     Pulse Rate 06/29/16 1902 (!) 112     Resp 06/29/16 1902 18     Temp 06/29/16 1902 98.8 F (37.1 C)     Temp Source 06/29/16 1902 Oral     SpO2 06/29/16 1902 98 %     Weight 06/29/16  1859 227 lb (103 kg)     Height 06/29/16 1859 5\' 3"  (1.6 m)     Head Circumference --      Peak Flow --      Pain Score 06/29/16 1900 8     Pain Loc --      Pain Edu? --      Excl. in [place]? --    No data found.   Updated Vital Signs BP 126/84 (BP Location: Left Arm)   Pulse (!) 112   Temp 98.8 F (37.1 C) (Oral)   Resp 18   Ht 5\' 3"  (1.6 m)   Wt 227 lb (103 kg)   LMP  (LMP Unknown) Comment: Lamp LMP 1 year ago, s/p uterine ablation per pt   SpO2 98%   BMI 40.21 kg/m   Visual Acuity Right Eye Distance:   Left Eye Distance:   Bilateral Distance:    Right Eye Near:   Left Eye Near:    Bilateral Near:     Physical Exam  Constitutional: She appears well-developed and well-nourished. No distress.  HENT:  Head: Normocephalic and atraumatic.  Right Ear: Tympanic membrane, external ear and ear canal normal.  Left Ear: Tympanic membrane, external ear and ear canal normal.  Nose: No mucosal edema, rhinorrhea, nose lacerations, sinus tenderness, nasal deformity, septal deviation or nasal septal hematoma. No epistaxis.  No foreign bodies. Right sinus exhibits no maxillary sinus tenderness and no frontal sinus tenderness. Left sinus exhibits no maxillary sinus tenderness and no frontal sinus tenderness.  Mouth/Throat: Uvula is midline, oropharynx is clear and moist and mucous membranes are normal. No oropharyngeal exudate.  Eyes: Conjunctivae and EOM are normal. Pupils are equal, round, and reactive to light. Right eye exhibits no discharge. Left eye exhibits no discharge. No scleral icterus.  Neck: Normal range of motion. Neck supple. No thyromegaly present.  Cardiovascular: Normal rate, regular rhythm and normal heart sounds.   Pulmonary/Chest: Effort normal and breath sounds normal. No respiratory distress. She has no wheezes. She has no rales. She exhibits tenderness (left mid ribs area).  Abdominal: Soft. Bowel sounds are normal. She exhibits no distension and no mass. There is no  tenderness. There is no rebound and no guarding. No hernia.  Lymphadenopathy:    She has no cervical adenopathy.  Neurological: She is alert.  Skin: She is not diaphoretic.  Nursing note and vitals reviewed.    UC Treatments / Results  Labs (all labs ordered are listed, but only abnormal results are displayed) Labs Reviewed - No data to display  EKG  EKG Interpretation None       Radiology Dg Ribs Unilateral W/chest Left  Result Date: 06/29/2016 CLINICAL DATA:  51 year old female with left chest and upper abdominal numbness. EXAM: LEFT  RIBS AND CHEST - 3+ VIEW COMPARISON:  Chest radiograph dated 05/12/2006 FINDINGS: The lungs are clear. There is no pleural effusion or pneumothorax. The cardiac silhouette is within normal limits. No acute osseous pathology or rib fractures. IMPRESSION: Negative. Electronically Signed   By: Anner Crete M.D.   On: 06/29/2016 19:54    Procedures Procedures (including critical care time)  Medications Ordered in UC Medications - No data to display   Initial Impression / Assessment and Plan / UC Course  I have reviewed the triage vital signs and the nursing notes.  Pertinent labs & imaging results that were available during my care of the patient were reviewed by me and considered in my medical decision making (see chart for details).       Final Clinical Impressions(s) / UC Diagnoses   Final diagnoses:  Paresthesia    New Prescriptions Discharge Medication List as of 06/29/2016  8:13 PM    START taking these medications   Details  HYDROcodone-acetaminophen (NORCO/VICODIN) 5-325 MG tablet 1-2 tabs po qd prn, Print       1. x-ray results and diagnosis reviewed with patient 2. rx as per orders above; reviewed possible side effects, interactions, risks and benefits  3. Follow-up with PCP and neurologist for further evaluation and management   Norval Gable, MD 06/29/16 2157

## 2016-06-29 NOTE — ED Triage Notes (Signed)
Patient complains of left upper abdominal numbness that she compares to shingles. Patient states that she noticed this days ago. Patient states that she also has left breast pain. Patient states that she also noticed a sharp pain at her belly button when she pressed on her naval.

## 2016-07-15 DIAGNOSIS — R1012 Left upper quadrant pain: Secondary | ICD-10-CM | POA: Insufficient documentation

## 2016-08-11 DIAGNOSIS — R0781 Pleurodynia: Secondary | ICD-10-CM | POA: Insufficient documentation

## 2016-08-15 DIAGNOSIS — E1142 Type 2 diabetes mellitus with diabetic polyneuropathy: Secondary | ICD-10-CM | POA: Insufficient documentation

## 2016-08-15 DIAGNOSIS — Z794 Long term (current) use of insulin: Secondary | ICD-10-CM | POA: Insufficient documentation

## 2016-08-26 DIAGNOSIS — N9089 Other specified noninflammatory disorders of vulva and perineum: Secondary | ICD-10-CM | POA: Insufficient documentation

## 2016-11-20 DIAGNOSIS — E785 Hyperlipidemia, unspecified: Secondary | ICD-10-CM | POA: Insufficient documentation

## 2016-11-20 DIAGNOSIS — E042 Nontoxic multinodular goiter: Secondary | ICD-10-CM | POA: Insufficient documentation

## 2016-11-20 DIAGNOSIS — E1169 Type 2 diabetes mellitus with other specified complication: Secondary | ICD-10-CM | POA: Insufficient documentation

## 2017-06-01 ENCOUNTER — Encounter: Payer: Self-pay | Admitting: Emergency Medicine

## 2017-06-01 ENCOUNTER — Other Ambulatory Visit: Payer: Self-pay

## 2017-06-01 ENCOUNTER — Ambulatory Visit
Admission: EM | Admit: 2017-06-01 | Discharge: 2017-06-01 | Disposition: A | Discharge status: Home / Self Care | Payer: Medicare Other | Attending: Family Medicine | Admitting: Family Medicine

## 2017-06-01 DIAGNOSIS — Z79899 Other long term (current) drug therapy: Secondary | ICD-10-CM | POA: Diagnosis not present

## 2017-06-01 DIAGNOSIS — Z7982 Long term (current) use of aspirin: Secondary | ICD-10-CM | POA: Insufficient documentation

## 2017-06-01 DIAGNOSIS — Z881 Allergy status to other antibiotic agents status: Secondary | ICD-10-CM | POA: Insufficient documentation

## 2017-06-01 DIAGNOSIS — I10 Essential (primary) hypertension: Secondary | ICD-10-CM | POA: Insufficient documentation

## 2017-06-01 DIAGNOSIS — Z888 Allergy status to other drugs, medicaments and biological substances status: Secondary | ICD-10-CM | POA: Insufficient documentation

## 2017-06-01 DIAGNOSIS — R300 Dysuria: Secondary | ICD-10-CM | POA: Diagnosis present

## 2017-06-01 DIAGNOSIS — F17210 Nicotine dependence, cigarettes, uncomplicated: Secondary | ICD-10-CM | POA: Diagnosis not present

## 2017-06-01 DIAGNOSIS — Z885 Allergy status to narcotic agent status: Secondary | ICD-10-CM | POA: Diagnosis not present

## 2017-06-01 DIAGNOSIS — Z880 Allergy status to penicillin: Secondary | ICD-10-CM | POA: Insufficient documentation

## 2017-06-01 DIAGNOSIS — Z886 Allergy status to analgesic agent status: Secondary | ICD-10-CM | POA: Insufficient documentation

## 2017-06-01 DIAGNOSIS — Z794 Long term (current) use of insulin: Secondary | ICD-10-CM | POA: Insufficient documentation

## 2017-06-01 DIAGNOSIS — N3001 Acute cystitis with hematuria: Secondary | ICD-10-CM | POA: Diagnosis not present

## 2017-06-01 DIAGNOSIS — E119 Type 2 diabetes mellitus without complications: Secondary | ICD-10-CM | POA: Diagnosis not present

## 2017-06-01 LAB — URINALYSIS, COMPLETE (UACMP) WITH MICROSCOPIC
Bilirubin Urine: NEGATIVE
KETONES UR: NEGATIVE mg/dL
NITRITE: POSITIVE — AB
Protein, ur: 30 mg/dL — AB
RBC / HPF: >50 RBC/hpf (ref 0–5)
SPECIFIC GRAVITY, URINE: 1.015 (ref 1.005–1.030)
pH: 5.5 (ref 5.0–8.0)

## 2017-06-01 MED ORDER — NITROFURANTOIN MONOHYD MACRO 100 MG PO CAPS: 100.0000 mg | ORAL_CAPSULE | Freq: Two times a day (BID) | ORAL | 0 refills | Status: DC

## 2017-06-01 NOTE — ED Triage Notes (Signed)
Patient c/o urinary pressure and some burning when urinating that started yesterday.  Patient also states that she has been peeing on herself.

## 2017-06-01 NOTE — ED Provider Notes (Signed)
[facility]    CSN: [id number] Arrival date & time: 06/01/17  1747  History   Chief Complaint Chief Complaint  Patient presents with  . Dysuria   HPI  52 year old female presents with UTI symptoms.  Patient reports that she has had urinary urgency, frequency.  She had one episode of dysuria.  She reports urinary incontinence.  No fevers or chills.  No abdominal pain.  Patient states that she recently had anal manometry and is unsure if this is the culprit of her symptoms.  Additionally, patient states that she recently started [place].  This may have precipitated her you UTI symptoms.  No other associated symptoms.  No otherconcerns at this time.  Past Medical History:  Diagnosis Date  . Carpal tunnel syndrome   . Depression   . Diabetes mellitus without complication ([place])   . Hypertension   . Obesity   . Sleep apnea    Past Surgical History:  Procedure Laterality Date  . CESAREAN SECTION  x 4  . LAPAROSCOPIC UNILATERAL SALPINGO OOPHERECTOMY Right    OB History   None    Home Medications    Prior to Admission medications   Medication Sig Start Date End Date Taking? Authorizing Provider  aspirin 81 MG tablet Take 81 mg by mouth daily.   Yes [provider]  Canagliflozin-Metformin HCl 50-500 MG TABS Take by mouth.   Yes [provider]  cyclobenzaprine (FLEXERIL) 10 MG tablet Take 10 mg by mouth 3 (three) times daily as needed for muscle spasms.   Yes [provider]  fluticasone (FLONASE) 50 MCG/ACT nasal spray Place 1 spray into both nostrils 2 (two) times daily. 11/03/14  Yes Betancourt, Aura Fey, NP  hydrochlorothiazide (HYDRODIURIL) 50 MG tablet Take 50 mg by mouth daily.   Yes [provider]  insulin glargine (LANTUS) 100 UNIT/ML injection Inject into the skin at bedtime.   Yes [provider]  losartan (COZAAR) 100 MG tablet Take 100 mg by mouth daily.   Yes [provider]  pregabalin (LYRICA) 75 MG  capsule Take 75 mg by mouth 2 (two) times daily.   Yes [provider]  ranitidine (ZANTAC) 150 MG tablet Take 150 mg by mouth 2 (two) times daily.   Yes [provider]  venlafaxine XR (EFFEXOR-XR) 75 MG 24 hr capsule Take 75 mg by mouth daily with breakfast.   Yes [provider]  acetaminophen (TYLENOL) 500 MG tablet Take 2 tablets (1,000 mg total) by mouth every 6 (six) hours as needed for moderate pain or fever. 11/03/14   Betancourt, Aura Fey, NP  famciclovir (FAMVIR) 500 MG tablet Take 1 tablet (500 mg total) by mouth 3 (three) times daily. 11/10/15   Lorin Picket, PA-C  glucose blood test strip 1 each by Other route as needed for other. Use as instructed    [provider]  latanoprost (XALATAN) 0.005 % ophthalmic solution 1 drop at bedtime.    [provider]  Multiple Vitamins-Minerals (MULTIVITAMIN PO) Take by mouth.    [provider]  nitrofurantoin, macrocrystal-monohydrate, (MACROBID) 100 MG capsule Take 1 capsule (100 mg total) by mouth 2 (two) times daily. 06/01/17   Coral Spikes, DO   Family History Allergies Daughter    Esophageal cancer Father    High blood pressure (Hypertension) Father    Hyperlipidemia (Elevated cholesterol) Father    Melanoma Father    Stomach cancer Father  mets to liver  Tongue cancer Father  No Known Problems Maternal Grandfather    No Known Problems Maternal Grandmother    Diabetes Mother    Heart disease Mother    High blood pressure (Hypertension) Mother    Hyperlipidemia (Elevated cholesterol) Mother    Hypothyroidism Mother    Myocardial Infarction (Heart attack) Paternal Grandfather  COD  Colon cancer Paternal Grandmother    High blood pressure (Hypertension) Sister    Seizures Sister    Diabetes Sister    High blood pressure (Hypertension) Sister    Depression Son    Diabetes Son    Hyperlipidemia (Elevated cholesterol) Son    Sleep  apnea Son    No Known Problems Son    No Known Problems Son     Social History Social History   Tobacco Use  . Smoking status: Current Every Day Smoker    Packs/day: 1.00  . Smokeless tobacco: Never Used  Substance Use Topics  . Alcohol use: No  . Drug use: No     Allergies   Clindamycin/lincomycin; Diphenhydramine hcl; Gabapentin; Gentamycin [gentamicin]; Ibuprofen; Meperidine and related; Penicillins; Pravastatin; Propranolol; and Simvastatin   Review of Systems Review of Systems  Constitutional: Negative.   Gastrointestinal: Negative.   Genitourinary: Positive for dysuria, frequency and urgency.   Physical Exam Triage Vital Signs ED Triage Vitals  Enc Vitals Group     BP 06/01/17 1806 107/79     Pulse Rate 06/01/17 1806 (!) 105     Resp 06/01/17 1806 14     Temp 06/01/17 1806 98.6 F (37 C)     Temp Source 06/01/17 1806 Oral     SpO2 06/01/17 1806 99 %     Weight 06/01/17 1758 213 lb (96.6 kg)     Height 06/01/17 1758 5\' 3"  (1.6 m)     Head Circumference --      Peak Flow --      Pain Score 06/01/17 1758 3     Pain Loc --      Pain Edu? --      Excl. in [place]? --    Updated Vital Signs BP 107/79 (BP Location: Left Arm)   Pulse (!) 105   Temp 98.6 F (37 C) (Oral)   Resp 14   Ht 5\' 3"  (1.6 m)   Wt 213 lb (96.6 kg)   LMP 05/17/2017 (Approximate)   SpO2 99%   BMI 37.73 kg/m   Physical Exam  Constitutional: She is oriented to person, place, and time. She appears well-developed. No distress.  Cardiovascular: Regular rhythm.  Tachycardic.  Pulmonary/Chest: Effort normal and breath sounds normal. She has no wheezes. She has no rales.  Abdominal: Soft. She exhibits no distension. There is no tenderness.  Neurological: She is alert and oriented to person, place, and time.  Psychiatric: She has a normal mood and affect. Her behavior is normal.  Nursing note and vitals reviewed.  UC Treatments / Results  Labs (all labs ordered are listed, but only  abnormal results are displayed) Labs Reviewed  URINALYSIS, COMPLETE (UACMP) WITH MICROSCOPIC - Abnormal; Notable for the following components:      Result Value   APPearance CLOUDY (*)    Glucose, UA >1000 (*)    Hgb urine dipstick LARGE (*)    Protein, ur 30 (*)    Nitrite POSITIVE (*)    Leukocytes, UA TRACE (*)    Bacteria, UA MANY (*)    All other components within normal limits  URINE CULTURE    EKG None  Radiology No results found.  Procedures Procedures (including critical care time)  Medications Ordered in UC Medications - No data to display  Initial Impression / Assessment and Plan / UC Course  I have reviewed the triage vital signs and the nursing notes.  Pertinent labs & imaging results that were available during my care of the patient were reviewed by me and considered in my medical decision making (see chart for details).    52 year old female presents with UTI symptoms.  History and urinalysis consistent with UTI.  Sending culture.  Treating with Macrobid.  Advised follow-up with PCP.  SGLT2 is known to cause frequent UTIs.  I advised her that she may need to discuss discontinuation with her primary care provider if she continues to have urinary issues/recurrent UTI.  Final Clinical Impressions(s) / UC Diagnoses   Final diagnoses:  Acute cystitis with hematuria   Discharge Instructions   None    ED Prescriptions    Medication Sig Dispense Auth. Provider   nitrofurantoin, macrocrystal-monohydrate, (MACROBID) 100 MG capsule Take 1 capsule (100 mg total) by mouth 2 (two) times daily. 14 capsule Coral Spikes, DO     Controlled Substance Prescriptions [place] Controlled Substance Registry consulted? Not Applicable   Coral Spikes, DO 06/01/17 1924

## 2017-06-04 LAB — URINE CULTURE

## 2017-06-05 NOTE — Progress Notes (Signed)
Urine culture is positive for E.Coli, this was treated at urgent care with Macrobid.

## 2017-08-18 DIAGNOSIS — D751 Secondary polycythemia: Secondary | ICD-10-CM | POA: Insufficient documentation

## 2017-08-18 DIAGNOSIS — E559 Vitamin D deficiency, unspecified: Secondary | ICD-10-CM | POA: Insufficient documentation

## 2017-12-11 DIAGNOSIS — E785 Hyperlipidemia, unspecified: Secondary | ICD-10-CM | POA: Insufficient documentation

## 2017-12-11 DIAGNOSIS — R790 Abnormal level of blood mineral: Secondary | ICD-10-CM | POA: Insufficient documentation

## 2018-02-07 DIAGNOSIS — E669 Obesity, unspecified: Secondary | ICD-10-CM | POA: Insufficient documentation

## 2018-02-22 ENCOUNTER — Encounter: Payer: Self-pay | Admitting: Emergency Medicine

## 2018-02-22 ENCOUNTER — Other Ambulatory Visit: Payer: Self-pay

## 2018-02-22 ENCOUNTER — Ambulatory Visit: Payer: Medicare Other

## 2018-02-22 ENCOUNTER — Ambulatory Visit
Admission: EM | Admit: 2018-02-22 | Discharge: 2018-02-22 | Disposition: A | Discharge status: Home / Self Care | Payer: Medicare Other | Attending: Family Medicine | Admitting: Family Medicine

## 2018-02-22 DIAGNOSIS — S92911A Unspecified fracture of right toe(s), initial encounter for closed fracture: Secondary | ICD-10-CM | POA: Diagnosis present

## 2018-02-22 DIAGNOSIS — L02411 Cutaneous abscess of right axilla: Secondary | ICD-10-CM

## 2018-02-22 DIAGNOSIS — W208XXA Other cause of strike by thrown, projected or falling object, initial encounter: Secondary | ICD-10-CM

## 2018-02-22 DIAGNOSIS — L0291 Cutaneous abscess, unspecified: Secondary | ICD-10-CM | POA: Insufficient documentation

## 2018-02-22 MED ORDER — TRAMADOL HCL 50 MG PO TABS: 50.0000 mg | ORAL_TABLET | Freq: Three times a day (TID) | ORAL | 0 refills | Status: DC | PRN

## 2018-02-22 MED ORDER — DOXYCYCLINE HYCLATE 100 MG PO CAPS: 100.0000 mg | ORAL_CAPSULE | Freq: Two times a day (BID) | ORAL | 0 refills | Status: DC

## 2018-02-22 NOTE — ED Triage Notes (Signed)
Pt c/o right foot pain. Started about a week ago after she dropped a pan on it. Also she has an abscess under her right axilla. Started about 2-3 days ago.

## 2018-02-22 NOTE — Discharge Instructions (Signed)
Medications as prescribed.  Return in 2 days for packing removal.   Take care  Dr. Lacinda Axon

## 2018-02-24 ENCOUNTER — Encounter: Payer: Self-pay | Admitting: Gynecology

## 2018-02-24 ENCOUNTER — Ambulatory Visit
Admission: EM | Admit: 2018-02-24 | Discharge: 2018-02-24 | Disposition: A | Discharge status: Home / Self Care | Payer: Medicare Other | Attending: Emergency Medicine | Admitting: Emergency Medicine

## 2018-02-24 DIAGNOSIS — L732 Hidradenitis suppurativa: Secondary | ICD-10-CM | POA: Diagnosis present

## 2018-02-24 DIAGNOSIS — Z4800 Encounter for change or removal of nonsurgical wound dressing: Secondary | ICD-10-CM

## 2018-02-24 DIAGNOSIS — L02411 Cutaneous abscess of right axilla: Secondary | ICD-10-CM

## 2018-02-24 DIAGNOSIS — L02419 Cutaneous abscess of limb, unspecified: Secondary | ICD-10-CM | POA: Diagnosis present

## 2018-02-24 NOTE — ED Provider Notes (Signed)
HPI  SUBJECTIVE:  Rebecca Whitaker is a 53 y.o. female who presents for wound check and packing removal. Patient was here 2 days ago for an I&D of an axillary abscess. she was placed on doxycycline, which she states she is taking.  States that it is draining blood and pus.  She reports increased size of induration.  She reports spontaneous rupture of a  secondary sinus close to the incision.  It is not feeling any worse, but it is not feeling any better.  No fevers, body aches.  She has been taking the doxycycline, tramadol nightly without improvement in her symptoms.  Symptoms are worse with arm movement.  She has a past medical history of recurrent axillary abscesses, diabetes, hypertension.  No history of MRSA.  TFT:[facility], [facility]   Past Medical History:  Diagnosis Date  . Carpal tunnel syndrome   . Depression   . Diabetes mellitus without complication ([place])   . Hypertension   . Obesity   . Sleep apnea     Past Surgical History:  Procedure Laterality Date  . CESAREAN SECTION  x 4  . LAPAROSCOPIC UNILATERAL SALPINGO OOPHERECTOMY Right     History reviewed. No pertinent family history.  Social History   Tobacco Use  . Smoking status: Current Every Day Smoker    Packs/day: 1.00  . Smokeless tobacco: Never Used  Substance Use Topics  . Alcohol use: No  . Drug use: No    No current facility-administered medications for this encounter.   Current Outpatient Medications:  .  acetaminophen (TYLENOL) 500 MG tablet, Take 2 tablets (1,000 mg total) by mouth every 6 (six) hours as needed for moderate pain or fever., Disp: 30 tablet, Rfl: 0 .  aspirin 81 MG tablet, Take 81 mg by mouth daily., Disp: , Rfl:  .  Canagliflozin-Metformin HCl 50-500 MG TABS, Take by mouth., Disp: , Rfl:  .  cyclobenzaprine (FLEXERIL) 10 MG tablet, Take 10 mg by mouth 3 (three) times daily as needed for muscle spasms., Disp: , Rfl:  .  doxycycline (VIBRAMYCIN) 100 MG capsule, Take 1 capsule (100 mg  total) by mouth 2 (two) times daily., Disp: 20 capsule, Rfl: 0 .  famciclovir (FAMVIR) 500 MG tablet, Take 1 tablet (500 mg total) by mouth 3 (three) times daily., Disp: 21 tablet, Rfl: 0 .  fluticasone (FLONASE) 50 MCG/ACT nasal spray, Place 1 spray into both nostrils 2 (two) times daily., Disp: 16 g, Rfl: 2 .  glucose blood test strip, 1 each by Other route as needed for other. Use as instructed, Disp: , Rfl:  .  hydrochlorothiazide (HYDRODIURIL) 50 MG tablet, Take 50 mg by mouth daily., Disp: , Rfl:  .  insulin glargine (LANTUS) 100 UNIT/ML injection, Inject into the skin at bedtime., Disp: , Rfl:  .  latanoprost (XALATAN) 0.005 % ophthalmic solution, 1 drop at bedtime., Disp: , Rfl:  .  losartan (COZAAR) 100 MG tablet, Take 100 mg by mouth daily., Disp: , Rfl:  .  meloxicam (MOBIC) 15 MG tablet, Take 15 mg by mouth daily., Disp: , Rfl:  .  Multiple Vitamins-Minerals (MULTIVITAMIN PO), Take by mouth., Disp: , Rfl:  .  omeprazole (PRILOSEC) 40 MG capsule, , Disp: , Rfl:  .  traMADol (ULTRAM) 50 MG tablet, Take 1 tablet (50 mg total) by mouth every 8 (eight) hours as needed., Disp: 10 tablet, Rfl: 0 .  venlafaxine XR (EFFEXOR-XR) 75 MG 24 hr capsule, Take 75 mg by mouth daily with breakfast., Disp: ,  Rfl:   Allergies  Allergen Reactions  . Clindamycin/Lincomycin   . Diphenhydramine Hcl   . Gabapentin   . Gentamycin [Gentamicin]   . Ibuprofen   . Meperidine And Related   . Penicillins   . Pravastatin   . Propranolol   . Simvastatin      ROS  As noted in HPI.   Physical Exam  BP 118/72 (BP Location: Left Arm)   Pulse 100   Temp 98.2 F (36.8 C) (Oral)   Wt 96.6 kg   LMP 01/24/2018   SpO2 99%   BMI 37.73 kg/m   Constitutional: Well developed, well nourished, no acute distress Eyes:  EOMI, conjunctiva normal bilaterally HENT: Normocephalic, atraumatic,mucus membranes moist Respiratory: Normal inspiratory effort Cardiovascular: Normal rate GI: nondistended skin: 14 x  3.5 cm area of tender induration right axilla.  Multiple tender masses.  No erythema.  Marked area of induration with a marker for reference.  Packing intact.  Positive area  superior to the incision that has scant expressible purulent drainage. Musculoskeletal: no deformities Neurologic: Alert & oriented x 3, no focal neuro deficits Psychiatric: Speech and behavior appropriate   ED Course   Medications - No data to display  Orders Placed This Encounter  Procedures  . Aerobic Culture (superficial specimen)    Standing Status:   Standing    Number of Occurrences:   1   ED Clinical Impression  Axillary abscess  Hidradenitis suppurativa   ED Assessment/Plan  Previous records reviewed. As noted in HPI.  Procedure note: Cleaned area with chlorhexidine and alcohol.  Removed packing.  Then irrigated with 180 cc of sterile saline.  Repacked loosely.  Placed bulky dressing.  Patient tolerated procedure well.  Patient is reporting increased induration, could be residual inflammation from the packing.  I marked the areas of induration today.  sending off wound culture.  Suspect  hidradenitis suppurativa.  Will refer to surgery, Dr. Peyton Najjar on call, for definitive treatment.  Not adding on another antibiotic at this point in time.  Patient reports difficulty breathing with penicillins.  Return to clinic in 2 days for reevaluation and packing removal or may follow-up with primary care physician for this.   No orders of the defined types were placed in this encounter.   *This clinic note was created using Dragon dictation software. Therefore, there may be occasional mistakes despite careful proofreading.   ?    Melynda Ripple, MD 02/25/18 1113

## 2018-02-24 NOTE — ED Triage Notes (Signed)
Patient is here for repack of abscess at right axillary.

## 2018-02-24 NOTE — Discharge Instructions (Addendum)
Return here in 2 days for repacking and reevaluation.  We should have the culture results back at that point in time and we can change your antibiotic therapy if necessary.  You can try 1 g of Tylenol combined with 600 mg of ibuprofen 3 times a day as needed for pain.  You will need to follow-up with a surgeon for definitive treatment of this issue.

## 2018-02-25 NOTE — ED Provider Notes (Signed)
[facility]    CSN: [id number] Arrival date & time: 02/22/18  1550  History   Chief Complaint Chief Complaint  Patient presents with  . Abscess  . Foot Pain    right   HPI   53 year old female presents with the above complaints.  Patient had 2 separate complaints.  Patient reports an abscess in her right axilla.  She has had this for about 2 to 3 days.  Pain is quite severe.  No current drainage.  Has a history of recurrent abscesses.  No medications tried.  She has used some compresses without resolution.  No fever.  Additionally, patient reports that 1 week ago she dropped a sheet pain on her right foot.  Patient has a swollen area on the dorsum of her foot.  She is concerned that she may have a fracture.  Patient has underlying neuropathy which makes it difficult for her to tell about her injuries.  No current wound.  She is ambulating without difficulty.  No other associated symptoms.  No other complaints.  History reviewed and updated as below. Past Medical History:  Diagnosis Date  . Carpal tunnel syndrome   . Depression   . Diabetes mellitus without complication ([place])   . Hypertension   . Obesity   . Sleep apnea    Past Surgical History:  Procedure Laterality Date  . CESAREAN SECTION  x 4  . LAPAROSCOPIC UNILATERAL SALPINGO OOPHERECTOMY Right     OB History   No obstetric history on file.      Home Medications    Prior to Admission medications   Medication Sig Start Date End Date Taking? Authorizing Provider  acetaminophen (TYLENOL) 500 MG tablet Take 2 tablets (1,000 mg total) by mouth every 6 (six) hours as needed for moderate pain or fever. 11/03/14  Yes Betancourt, Aura Fey, NP  aspirin 81 MG tablet Take 81 mg by mouth daily.   Yes [provider]  Canagliflozin-Metformin HCl 50-500 MG TABS Take by mouth.   Yes [provider]  cyclobenzaprine (FLEXERIL) 10 MG tablet Take 10 mg by mouth 3 (three) times daily as needed for  muscle spasms.   Yes [provider]  famciclovir (FAMVIR) 500 MG tablet Take 1 tablet (500 mg total) by mouth 3 (three) times daily. 11/10/15  Yes Lorin Picket, PA-C  fluticasone (FLONASE) 50 MCG/ACT nasal spray Place 1 spray into both nostrils 2 (two) times daily. 11/03/14  Yes Betancourt, Tina A, NP  glucose blood test strip 1 each by Other route as needed for other. Use as instructed   Yes [provider]  hydrochlorothiazide (HYDRODIURIL) 50 MG tablet Take 50 mg by mouth daily.   Yes [provider]  insulin glargine (LANTUS) 100 UNIT/ML injection Inject into the skin at bedtime.   Yes [provider]  latanoprost (XALATAN) 0.005 % ophthalmic solution 1 drop at bedtime.   Yes [provider]  losartan (COZAAR) 100 MG tablet Take 100 mg by mouth daily.   Yes [provider]  meloxicam (MOBIC) 15 MG tablet Take 15 mg by mouth daily. 02/07/18  Yes [provider]  Multiple Vitamins-Minerals (MULTIVITAMIN PO) Take by mouth.   Yes [provider]  omeprazole (PRILOSEC) 40 MG capsule  02/10/18  Yes [provider]  venlafaxine XR (EFFEXOR-XR) 75 MG 24 hr capsule Take 75 mg by mouth daily with breakfast.   Yes [provider]  doxycycline (VIBRAMYCIN) 100 MG capsule Take 1 capsule (100 mg  total) by mouth 2 (two) times daily. 02/22/18   Coral Spikes, DO  traMADol (ULTRAM) 50 MG tablet Take 1 tablet (50 mg total) by mouth every 8 (eight) hours as needed. 02/22/18   Coral Spikes, DO   Social History Social History   Tobacco Use  . Smoking status: Current Every Day Smoker    Packs/day: 1.00  . Smokeless tobacco: Never Used  Substance Use Topics  . Alcohol use: No  . Drug use: No     Allergies   Clindamycin/lincomycin; Diphenhydramine hcl; Gabapentin; Gentamycin [gentamicin]; Ibuprofen; Meperidine and related; Penicillins; Pravastatin; Propranolol; and Simvastatin   Review of Systems Review of Systems   Constitutional: Negative for fever.  Musculoskeletal:       Foot pain/injury (right)  Skin:       Abscess.   Physical Exam Triage Vital Signs ED Triage Vitals  Enc Vitals Group     BP 02/22/18 1611 111/75     Pulse Rate 02/22/18 1611 97     Resp 02/22/18 1611 18     Temp 02/22/18 1611 98.9 F (37.2 C)     Temp Source 02/22/18 1611 Oral     SpO2 02/22/18 1611 98 %     Weight 02/22/18 1603 213 lb (96.6 kg)     Height 02/22/18 1603 5\' 3"  (1.6 m)     Head Circumference --      Peak Flow --      Pain Score 02/22/18 1603 7     Pain Loc --      Pain Edu? --    Updated Vital Signs BP 111/75 (BP Location: Left Arm)   Pulse 97   Temp 98.9 F (37.2 C) (Oral)   Resp 18   Ht 5\' 3"  (1.6 m)   Wt 96.6 kg   LMP 01/22/2018   SpO2 98%   BMI 37.73 kg/m   Visual Acuity Right Eye Distance:   Left Eye Distance:   Bilateral Distance:    Right Eye Near:   Left Eye Near:    Bilateral Near:     Physical Exam Vitals signs and nursing note reviewed.  Constitutional:      General: She is not in acute distress.    Appearance: She is obese.  HENT:     Head: Normocephalic and atraumatic.  Eyes:     General:        Right eye: No discharge.        Left eye: No discharge.     Conjunctiva/sclera: Conjunctivae normal.  Pulmonary:     Effort: Pulmonary effort is normal. No respiratory distress.  Musculoskeletal:     Comments: Right foot -patient has a raised area on the dorsum of the foot in the midline which is tender to palpation.  No other discrete areas of tenderness.  Skin:    Comments: Right axilla -large, 3.5 x 6.5 cm abscess.  Exquisitely tender to palpation.  Very fluctuant.  Neurological:     Mental Status: She is alert.  Psychiatric:        Mood and Affect: Mood normal.        Behavior: Behavior normal.    UC Treatments / Results  Labs (all labs ordered are listed, but only abnormal results are displayed) Labs Reviewed - No data to  display  EKG None  Radiology   Procedures Incision and Drainage Date/Time: 02/25/2018 9:01 AM Performed by: Coral Spikes, DO Authorized by: Coral Spikes, DO   Consent:  Consent obtained:  Verbal   Consent given by:  Patient Location:    Type:  Abscess   Location:  Upper extremity   Upper extremity location: Right axilla. Pre-procedure details:    Skin preparation:  Betadine Anesthesia (see MAR for exact dosages):    Anesthesia method:  Local infiltration   Local anesthetic:  Lidocaine 1% WITH epi Procedure type:    Complexity:  Simple Procedure details:    Incision types:  Stab incision   Scalpel blade:  11   Wound management:  Probed and deloculated   Drainage:  Bloody and purulent   Drainage amount:  Copious   Wound treatment:  Wound left open   Packing materials:  1/4 in gauze Post-procedure details:    Patient tolerance of procedure:  Tolerated with difficulty   (including critical care time)  Medications Ordered in UC Medications - No data to display  Initial Impression / Assessment and Plan / UC Course  I have reviewed the triage vital signs and the nursing notes.  Pertinent labs & imaging results that were available during my care of the patient were reviewed by me and considered in my medical decision making (see chart for details).    53 year old female presents with an abscess.  Abscess was drained today.  Patient to return in 2 days for packing removal.  Placed on doxycycline.  Regarding her foot injury, x-ray revealed a closed, subacute fracture of the distal aspect of the third proximal phalanx.  Offered postop shoe and patient declined.  Tramadol for pain.  Final Clinical Impressions(s) / UC Diagnoses   Final diagnoses:  Abscess  Closed displaced fracture of proximal phalanx of toe of right foot     Discharge Instructions     Medications as prescribed.  Return in 2 days for packing removal.   Take care  Dr. Lacinda Axon     ED  Prescriptions    Medication Sig Dispense Auth. Provider   doxycycline (VIBRAMYCIN) 100 MG capsule Take 1 capsule (100 mg total) by mouth 2 (two) times daily. 20 capsule Petula Rotolo G, DO   traMADol (ULTRAM) 50 MG tablet Take 1 tablet (50 mg total) by mouth every 8 (eight) hours as needed. 10 tablet Coral Spikes, DO     Controlled Substance Prescriptions [place] Controlled Substance Registry consulted? Not Applicable   Coral Spikes, [place] 02/25/18 3704

## 2018-02-26 ENCOUNTER — Other Ambulatory Visit: Payer: Self-pay

## 2018-02-26 ENCOUNTER — Encounter: Payer: Self-pay | Admitting: Emergency Medicine

## 2018-02-26 ENCOUNTER — Ambulatory Visit
Admission: EM | Admit: 2018-02-26 | Discharge: 2018-02-26 | Disposition: A | Discharge status: Home / Self Care | Payer: Medicare Other | Attending: Family Medicine | Admitting: Family Medicine

## 2018-02-26 DIAGNOSIS — E119 Type 2 diabetes mellitus without complications: Secondary | ICD-10-CM

## 2018-02-26 DIAGNOSIS — Z5189 Encounter for other specified aftercare: Secondary | ICD-10-CM | POA: Insufficient documentation

## 2018-02-26 DIAGNOSIS — L02411 Cutaneous abscess of right axilla: Secondary | ICD-10-CM

## 2018-02-26 NOTE — ED Triage Notes (Signed)
Patient here for removal of her packing and reevaluation under her right axillary from visit 2 days ago

## 2018-02-26 NOTE — ED Provider Notes (Signed)
[facility]    CSN: [id number] Arrival date & time: 02/26/18  1626     History   Chief Complaint Chief Complaint  Patient presents with  . Wound Check    APPT    HPI Rebecca Whitaker is a 53 y.o. female.   53 yo female s/p I&D for right axillary abscess 4 days ago. States she feels much better. She had the wound repacked 2 days ago but states that today the packing had come out. Denies any fevers, chills, pain. Still taking prescribed antibiotic.   The history is provided by the patient.  Wound Check     Past Medical History:  Diagnosis Date  . Carpal tunnel syndrome   . Depression   . Diabetes mellitus without complication ([place])   . Hypertension   . Obesity   . Sleep apnea     There are no active problems to display for this patient.   Past Surgical History:  Procedure Laterality Date  . CESAREAN SECTION  x 4  . LAPAROSCOPIC UNILATERAL SALPINGO OOPHERECTOMY Right     OB History   No obstetric history on file.      Home Medications    Prior to Admission medications   Medication Sig Start Date End Date Taking? Authorizing Provider  acetaminophen (TYLENOL) 500 MG tablet Take 2 tablets (1,000 mg total) by mouth every 6 (six) hours as needed for moderate pain or fever. 11/03/14  Yes Betancourt, Aura Fey, NP  aspirin 81 MG tablet Take 81 mg by mouth daily.   Yes [provider]  Canagliflozin-Metformin HCl 50-500 MG TABS Take by mouth.   Yes [provider]  cyclobenzaprine (FLEXERIL) 10 MG tablet Take 10 mg by mouth 3 (three) times daily as needed for muscle spasms.   Yes [provider]  doxycycline (VIBRAMYCIN) 100 MG capsule Take 1 capsule (100 mg total) by mouth 2 (two) times daily. 02/22/18  Yes Cook, Jayce G, DO  famciclovir (FAMVIR) 500 MG tablet Take 1 tablet (500 mg total) by mouth 3 (three) times daily. 11/10/15  Yes Lorin Picket, PA-C  fluticasone (FLONASE) 50 MCG/ACT nasal spray Place 1 spray into both  nostrils 2 (two) times daily. 11/03/14  Yes Betancourt, Tina A, NP  glucose blood test strip 1 each by Other route as needed for other. Use as instructed   Yes [provider]  hydrochlorothiazide (HYDRODIURIL) 50 MG tablet Take 50 mg by mouth daily.   Yes [provider]  insulin glargine (LANTUS) 100 UNIT/ML injection Inject into the skin at bedtime.   Yes [provider]  latanoprost (XALATAN) 0.005 % ophthalmic solution 1 drop at bedtime.   Yes [provider]  losartan (COZAAR) 100 MG tablet Take 100 mg by mouth daily.   Yes [provider]  meloxicam (MOBIC) 15 MG tablet Take 15 mg by mouth daily. 02/07/18  Yes [provider]  Multiple Vitamins-Minerals (MULTIVITAMIN PO) Take by mouth.   Yes [provider]  omeprazole (PRILOSEC) 40 MG capsule  02/10/18  Yes [provider]  traMADol (ULTRAM) 50 MG tablet Take 1 tablet (50 mg total) by mouth every 8 (eight) hours as needed. 02/22/18  Yes Cook, Jayce G, DO  venlafaxine XR (EFFEXOR-XR) 75 MG 24 hr capsule Take 75 mg by mouth daily with breakfast.   Yes [provider]    Family History History reviewed. No pertinent family history.  Social History Social History   Tobacco Use  . Smoking  status: Current Every Day Smoker    Packs/day: 1.00  . Smokeless tobacco: Never Used  Substance Use Topics  . Alcohol use: No  . Drug use: No     Allergies   Clindamycin/lincomycin; Diphenhydramine hcl; Gabapentin; Gentamycin [gentamicin]; Ibuprofen; Meperidine and related; Penicillins; Pravastatin; Propranolol; and Simvastatin   Review of Systems Review of Systems   Physical Exam Triage Vital Signs ED Triage Vitals  Enc Vitals Group     BP 02/26/18 1700 117/79     Pulse Rate 02/26/18 1700 (!) 105     Resp 02/26/18 1700 18     Temp 02/26/18 1700 99.7 F (37.6 C)     Temp Source 02/26/18 1700 Oral     SpO2 02/26/18 1700 100 %     Weight 02/26/18 1658 213 lb  (96.6 kg)     Height 02/26/18 1658 5\' 3"  (1.6 m)     Head Circumference --      Peak Flow --      Pain Score 02/26/18 1658 0     Pain Loc --      Pain Edu? --      Excl. in [place]? --    No data found.  Updated Vital Signs BP 117/79 (BP Location: Left Arm)   Pulse (!) 105   Temp 99.7 F (37.6 C) (Oral)   Resp 18   Ht 5\' 3"  (1.6 m)   Wt 96.6 kg   SpO2 100%   BMI 37.73 kg/m   Visual Acuity Right Eye Distance:   Left Eye Distance:   Bilateral Distance:    Right Eye Near:   Left Eye Near:    Bilateral Near:     Physical Exam Vitals signs and nursing note reviewed.  Constitutional:      General: She is not in acute distress.    Appearance: Normal appearance. She is not toxic-appearing or diaphoretic.  Skin:    Comments: Healing incision wound on right axilla with minimal purulent drainage; non-tender; non-erythematous  Neurological:     Mental Status: She is alert.      UC Treatments / Results  Labs (all labs ordered are listed, but only abnormal results are displayed) Labs Reviewed - No data to display  EKG None  Radiology No results found.  Procedures Procedures (including critical care time)  Medications Ordered in UC Medications - No data to display  Initial Impression / Assessment and Plan / UC Course  I have reviewed the triage vital signs and the nursing notes.  Pertinent labs & imaging results that were available during my care of the patient were reviewed by me and considered in my medical decision making (see chart for details).      Final Clinical Impressions(s) / UC Diagnoses   Final diagnoses:  Wound check, abscess  (healing)   Discharge Instructions     Continue and finish current antibiotic Continue warm compresses Follow up as needed    ED Prescriptions    None      1. diagnosis reviewed with patient; no need for repacking 2. Continue and finish current oral antibiotic 3. F/u prn   Controlled Substance  Prescriptions [place] Controlled Substance Registry consulted? Not Applicable   Norval Gable, MD 02/26/18 [phone]

## 2018-02-26 NOTE — Discharge Instructions (Signed)
Continue and finish current antibiotic Continue warm compresses Follow up as needed

## 2018-02-27 LAB — AEROBIC CULTURE W GRAM STAIN (SUPERFICIAL SPECIMEN)

## 2018-02-27 LAB — AEROBIC CULTURE  (SUPERFICIAL SPECIMEN)

## 2018-02-28 ENCOUNTER — Telehealth (HOSPITAL_COMMUNITY): Payer: Self-pay | Admitting: Emergency Medicine

## 2018-02-28 NOTE — Telephone Encounter (Signed)
Patient contacted and made aware of all results, all questions answered. Will finish doxy, states she is feeling much better and the abscess is almost healed.

## 2018-03-06 DIAGNOSIS — M79641 Pain in right hand: Secondary | ICD-10-CM | POA: Insufficient documentation

## 2018-04-10 DIAGNOSIS — R918 Other nonspecific abnormal finding of lung field: Secondary | ICD-10-CM | POA: Insufficient documentation

## 2018-04-10 DIAGNOSIS — Z716 Tobacco abuse counseling: Secondary | ICD-10-CM | POA: Insufficient documentation

## 2018-06-13 ENCOUNTER — Other Ambulatory Visit: Payer: Self-pay | Admitting: Acute Care

## 2018-06-13 DIAGNOSIS — G35 Multiple sclerosis: Secondary | ICD-10-CM

## 2018-07-03 DIAGNOSIS — M545 Low back pain, unspecified: Secondary | ICD-10-CM | POA: Insufficient documentation

## 2018-07-03 DIAGNOSIS — R202 Paresthesia of skin: Secondary | ICD-10-CM | POA: Insufficient documentation

## 2018-07-03 DIAGNOSIS — R200 Anesthesia of skin: Secondary | ICD-10-CM | POA: Insufficient documentation

## 2018-07-09 NOTE — Progress Notes (Signed)
Patient's Name: Rebecca Whitaker  MRN: [id number]  Referring Provider: Jannifer Franklin, NP  DOB: February 19, 1965  PCP: [facility]  DOS: 07/12/2018  Note by: Rebecca Santa, MD  Service setting: Ambulatory outpatient  Specialty: Interventional Pain Management  Location: [facility] Virtual Visit  Visit type: Initial Patient Evaluation  Patient type: New Patient   Pain Management Virtual Encounter Note - Virtual Visit via Video Conference and telephone Telehealth (real-time audio visits between healthcare provider and patient).   Patient's Phone No.:  [phone] (home); There is no such number on file (mobile).; (Preferred) [phone] [email or web address]  [place], [place] [address] Phone: [phone] Fax: [phone]    Pre-screening note:  Our staff contacted Rebecca Whitaker and offered her an "in person", "face-to-face" appointment versus a telephone encounter. She indicated preferring the telephone encounter, at this time.  Primary Reason(s) for Visit: Tele-Encounter for initial evaluation of one or more chronic problems (new to examiner) potentially causing chronic pain, and posing a threat to normal musculoskeletal function. (Level of risk: High) CC: No chief complaint on file.  I attempted to contact Rebecca Whitaker on 07/12/2018 via video conference and telephone, no response. Voicemail left. Patient will need to call to reschedule.  Of note, information below regarding HPI, review of systems was collected by nursing staff yesterday (07/11/2026) for med reconciliation and has been input below.  Onset and Duration: Gradual and Present longer than 3 months Cause of pain: Unknown Severity: Getting worse, NAS-11 at its worse: 10/10, NAS-11 at its best: 8/10, NAS-11 now: 8/10 and NAS-11 on the average: 8/10 Timing: Night Aggravating Factors: Prolonged sitting and laying down Alleviating Factors:  Medications Associated Problems: Day-time cramps, Night-time cramps, Temperature changes, Pain that wakes patient up and Pain that does not allow patient to sleep Quality of Pain: Aching, Cramping, Throbbing and Tingling Previous Examinations or Tests: Neurological evaluation Previous Treatments: The patient denies treatments   ROS  Cardiovascular: Daily Aspirin intake Pulmonary or Respiratory: Temporary stoppage of breathing during sleep Neurological: No reported neurological signs or symptoms such as seizures, abnormal skin sensations, urinary and/or fecal incontinence, being born with an abnormal open spine and/or a tethered spinal cord Review of Past Neurological Studies:  Results for orders placed or performed during the hospital encounter of 05/10/16  MR BRAIN W WO CONTRAST   Narrative   CLINICAL DATA:  Followup multiple sclerosis. Numbness in the legs and feet.  EXAM: MRI HEAD WITHOUT AND WITH CONTRAST  TECHNIQUE: Multiplanar, multiecho pulse sequences of the brain and surrounding structures were obtained without and with intravenous contrast.  CONTRAST:  91mL MULTIHANCE GADOBENATE DIMEGLUMINE 529 MG/ML IV SOLN  COMPARISON:  None.  FINDINGS: Brain: Diffusion imaging does not show any acute or subacute infarction. The brainstem and cerebellum are normal. Cerebral hemispheres show dilated perivascular spaces as a normal variant. There are a few small foci of T2 and FLAIR signal within the deep brain, primarily in the periventricular white matter including the left side of the posterior body of the corpus callosum. The findings are minimal but could indicate demyelinating disease/ multiple sclerosis. No cortical or large vessel territory insult. No mass lesion, hemorrhage, hydrocephalus or extra-axial collection. No abnormal contrast enhancement occurs.  Vascular: Major vessels at the base of the brain show flow.  Skull and upper cervical spine:  Negative  Sinuses/Orbits: Clear/normal  Other: None significant  IMPRESSION: Few tiny foci of T2 and FLAIR signal  within the deep white matter adjacent to the lateral ventricles including the left side of the posterior body of the corpus callosum. Findings are minimal and nonspecific but could be an indication of multiple sclerosis/ demyelinating disease. No restricted diffusion or contrast enhancement.   Electronically Signed   By: Nelson Chimes M.D.   On: 05/10/2016 13:44    Psychological-Psychiatric: Depressed Gastrointestinal: Reflux or heatburn Genitourinary: No reported renal or genitourinary signs or symptoms such as difficulty voiding or producing urine, peeing blood, non-functioning kidney, kidney stones, difficulty emptying the bladder, difficulty controlling the flow of urine, or chronic kidney disease Hematological: No reported hematological signs or symptoms such as prolonged bleeding, low or poor functioning platelets, bruising or bleeding easily, hereditary bleeding problems, low energy levels due to low hemoglobin or being anemic Endocrine: High blood sugar requiring insulin (IDDM) Rheumatologic: No reported rheumatological signs and symptoms such as fatigue, joint pain, tenderness, swelling, redness, heat, stiffness, decreased range of motion, with or without associated rash Musculoskeletal: Negative for myasthenia gravis, muscular dystrophy, multiple sclerosis or malignant hyperthermia Work History: Disabled   [place]  Drug: [name]  reports no history of drug use. Alcohol:  reports no history of alcohol use. Tobacco:  reports that she has been smoking. She has been smoking about 1.00 pack per day. She has never used smokeless tobacco. Medical:  has a past medical history of Carpal tunnel syndrome, Depression, Diabetes mellitus without complication ([place]), Hypertension, Obesity, and Sleep apnea. Family: family history is not on file.  Past Surgical History:   Procedure Laterality Date  . CESAREAN SECTION  x 4  . LAPAROSCOPIC UNILATERAL SALPINGO OOPHERECTOMY Right    Active Ambulatory Problems    Diagnosis Date Noted  . Acute pain of left shoulder 04/23/2016  . Bilateral carpal tunnel syndrome 11/02/2010  . Diabetic autonomic neuropathy associated with type 2 diabetes mellitus ([place]) 12/03/2013  . Low back pain 07/03/2018  . History of depression 01/21/2016  . MS (multiple sclerosis) ([place]) 04/23/2016  . Fibromyalgia 12/17/2014  . Obesity, morbid, BMI 40.0-49.9 ([place]) 04/17/2013  . Pelvic pain in female 02/23/2016  . OSA (obstructive sleep apnea) 02/20/2013   Resolved Ambulatory Problems    Diagnosis Date Noted  . No Resolved Ambulatory Problems   Past Medical History:  Diagnosis Date  . Carpal tunnel syndrome   . Depression   . Diabetes mellitus without complication ([place])   . Hypertension   . Obesity   . Sleep apnea

## 2018-07-10 ENCOUNTER — Ambulatory Visit: Payer: Medicare Other

## 2018-07-12 ENCOUNTER — Other Ambulatory Visit: Payer: Self-pay

## 2018-07-12 ENCOUNTER — Ambulatory Visit
Payer: Medicare Other | Attending: Student in an Organized Health Care Education/Training Program | Admitting: Student in an Organized Health Care Education/Training Program

## 2018-07-12 DIAGNOSIS — G894 Chronic pain syndrome: Secondary | ICD-10-CM

## 2018-08-10 DIAGNOSIS — N938 Other specified abnormal uterine and vaginal bleeding: Secondary | ICD-10-CM | POA: Insufficient documentation

## 2018-09-13 DIAGNOSIS — R413 Other amnesia: Secondary | ICD-10-CM | POA: Insufficient documentation

## 2018-09-13 DIAGNOSIS — M79605 Pain in left leg: Secondary | ICD-10-CM | POA: Insufficient documentation

## 2018-09-13 DIAGNOSIS — G479 Sleep disorder, unspecified: Secondary | ICD-10-CM | POA: Insufficient documentation

## 2018-09-13 DIAGNOSIS — M79604 Pain in right leg: Secondary | ICD-10-CM | POA: Insufficient documentation

## 2018-10-04 ENCOUNTER — Other Ambulatory Visit (INDEPENDENT_AMBULATORY_CARE_PROVIDER_SITE_OTHER): Payer: Self-pay | Admitting: Nurse Practitioner

## 2018-10-04 DIAGNOSIS — M79604 Pain in right leg: Secondary | ICD-10-CM

## 2018-10-04 DIAGNOSIS — M79605 Pain in left leg: Secondary | ICD-10-CM

## 2018-10-05 ENCOUNTER — Other Ambulatory Visit: Payer: Self-pay

## 2018-10-05 ENCOUNTER — Ambulatory Visit (INDEPENDENT_AMBULATORY_CARE_PROVIDER_SITE_OTHER): Payer: Medicare Other

## 2018-10-05 ENCOUNTER — Ambulatory Visit (INDEPENDENT_AMBULATORY_CARE_PROVIDER_SITE_OTHER): Payer: Medicare Other | Admitting: Nurse Practitioner

## 2018-10-05 ENCOUNTER — Encounter (INDEPENDENT_AMBULATORY_CARE_PROVIDER_SITE_OTHER): Payer: Self-pay | Admitting: Nurse Practitioner

## 2018-10-05 ENCOUNTER — Encounter (INDEPENDENT_AMBULATORY_CARE_PROVIDER_SITE_OTHER): Payer: Self-pay

## 2018-10-05 VITALS — BP 141/89 | HR 98 | Resp 14 | Ht 63.0 in | Wt 206.0 lb

## 2018-10-05 DIAGNOSIS — M79605 Pain in left leg: Secondary | ICD-10-CM

## 2018-10-05 DIAGNOSIS — E1142 Type 2 diabetes mellitus with diabetic polyneuropathy: Secondary | ICD-10-CM | POA: Diagnosis not present

## 2018-10-05 DIAGNOSIS — I10 Essential (primary) hypertension: Secondary | ICD-10-CM | POA: Diagnosis not present

## 2018-10-05 DIAGNOSIS — Z794 Long term (current) use of insulin: Secondary | ICD-10-CM

## 2018-10-05 DIAGNOSIS — M79604 Pain in right leg: Secondary | ICD-10-CM

## 2018-10-07 ENCOUNTER — Encounter (INDEPENDENT_AMBULATORY_CARE_PROVIDER_SITE_OTHER): Payer: Self-pay | Admitting: Nurse Practitioner

## 2018-10-07 NOTE — Progress Notes (Signed)
SUBJECTIVE:  Patient ID: Rebecca Whitaker, female    DOB: 1965-03-26, 52 y.o.   MRN: [id number] Chief Complaint  Patient presents with  . New Patient (Initial Visit)    HPI  Rebecca Whitaker is a 53 y.o. female  That presents today with bilateral leg pain.  The patient has a history of varicose veins which she feels may be giving her some of her issues.  The patient recently underwent ABIs and an outside facility which revealed to be normal.  The patient states that she has pain mainly in her upper legs and describes as a constant aching throbbing sometimes burning sensation.  The patient also has an extensive history of neuropathy.  She is currently on Lyrica without much change in her symptoms.  This is currently being managed by neurology.  Otherwise the patient denies any fever, chills, nausea, vomiting or diarrhea.  Non-invasive studies show no evidence of DVT, superficial venous thrombosis, or chronic venous insufficiency bilaterally. Past Medical History:  Diagnosis Date  . Carpal tunnel syndrome   . Depression   . Diabetes mellitus without complication ([place])   . Hypertension   . Obesity   . Sleep apnea     Past Surgical History:  Procedure Laterality Date  . CESAREAN SECTION  x 4  . LAPAROSCOPIC UNILATERAL SALPINGO OOPHERECTOMY Right     Social History   Socioeconomic History  . Marital status: Married    Spouse name: Not on file  . Number of children: Not on file  . Years of education: Not on file  . Highest education level: Not on file  Occupational History  . Not on file  Social Needs  . Financial resource strain: Not on file  . Food insecurity    Worry: Not on file    Inability: Not on file  . Transportation needs    Medical: Not on file    Non-medical: Not on file  Tobacco Use  . Smoking status: Current Every Day Smoker    Packs/day: 1.00  . Smokeless tobacco: Never Used  Substance and Sexual Activity  . Alcohol use: No  . Drug use: No  .  Sexual activity: Not on file  Lifestyle  . Physical activity    Days per week: Not on file    Minutes per session: Not on file  . Stress: Not on file  Relationships  . Social Herbalist on phone: Not on file    Gets together: Not on file    Attends religious service: Not on file    Active member of club or organization: Not on file    Attends meetings of clubs or organizations: Not on file    Relationship status: Not on file  . Intimate partner violence    Fear of current or ex partner: Not on file    Emotionally abused: Not on file    Physically abused: Not on file    Forced sexual activity: Not on file  Other Topics Concern  . Not on file  Social History Narrative  . Not on file    History reviewed. No pertinent family history.  Allergies  Allergen Reactions  . Clindamycin Shortness Of Breath    ABG ALSO  ABG ALSO  Other reaction(s): Unknown ABG ALSO  Other reaction(s): Unknown   . Diphenhydramine Hcl (Sleep) Hives  . Succinylcholine     Pseudocholinesterase deficiency  . Clindamycin/Lincomycin   . Diphenhydramine Hcl   . Gabapentin   .  Gentamycin [Gentamicin]   . Ibuprofen   . Lincomycin   . Meperidine And Related   . Nsaids Other (See Comments)  . Penicillins   . Pertussis Vaccines Other (See Comments)    Can't remember Can't remember   . Pravastatin   . Propranolol   . Simvastatin      Review of Systems   Review of Systems: Negative Unless Checked Constitutional: [] Weight loss  [] Fever  [] Chills Cardiac: [] Chest pain   []  Atrial Fibrillation  [] Palpitations   [] Shortness of breath when laying flat   [] Shortness of breath with exertion. [] Shortness of breath at rest Vascular:  [] Pain in legs with walking   [] Pain in legs with standing [] Pain in legs when laying flat   [] Claudication    [] Pain in feet when laying flat    [] History of DVT   [] Phlebitis   [] Swelling in legs   [x] Varicose veins   [] Non-healing ulcers Pulmonary:   [] Uses home  oxygen   [] Productive cough   [] Hemoptysis   [] Wheeze  [] COPD   [] Asthma Neurologic:  [] Dizziness   [] Seizures  [] Blackouts [] History of stroke   [] History of TIA  [] Aphasia   [] Temporary Blindness   [x] Weakness or numbness in arm   [x] Weakness or numbness in leg Musculoskeletal:   [] Joint swelling   [] Joint pain   [x] Low back pain  []  History of Knee Replacement [] Arthritis [] back Surgeries  []  Spinal Stenosis    Hematologic:  [] Easy bruising  [] Easy bleeding   [] Hypercoagulable state   [] Anemic Gastrointestinal:  [] Diarrhea   [] Vomiting  [] Gastroesophageal reflux/heartburn   [] Difficulty swallowing. [] Abdominal pain Genitourinary:  [] Chronic kidney disease   [] Difficult urination  [] Anuric   [] Blood in urine [] Frequent urination  [] Burning with urination   [] Hematuria Skin:  [] Rashes   [] Ulcers [] Wounds Psychological:  [] History of anxiety   [x]  History of major depression  [x]  Memory Difficulties      OBJECTIVE:   Physical Exam  BP (!) 141/89 (BP Location: Left Arm, Patient Position: Sitting, Cuff Size: Normal)   Pulse 98   Resp 14   Ht 5\' 3"  (1.6 m)   Wt 206 lb (93.4 kg)   BMI 36.49 kg/m   Gen: WD/WN, NAD Head: [place]/AT, No temporalis wasting.  Ear/Nose/Throat: Hearing grossly intact, nares w/o erythema or drainage Eyes: PER, EOMI, sclera nonicteric.  Neck: Supple, no masses.  No JVD.  Pulmonary:  Good air movement, no use of accessory muscles.  Cardiac: RRR Vascular: scattered varicosities present bilaterally.    2+ soft pitting edema  Vessel Right Left  Radial Palpable Palpable  Dorsalis Pedis Palpable Palpable  Posterior Tibial Palpable Palpable   Gastrointestinal: soft, non-distended. No guarding/no peritoneal signs.  Musculoskeletal: M/S 5/5 throughout.  No deformity or atrophy.  Neurologic: Pain and light touch intact in extremities.  Symmetrical.  Speech is fluent. Motor exam as listed above. Psychiatric: Judgment intact, Mood & affect appropriate for pt's clinical  situation. Dermatologic: No Venous rashes. No Ulcers Noted.  No changes consistent with cellulitis. Lymph : No Cervical lymphadenopathy, no lichenification or skin changes of chronic lymphedema.       ASSESSMENT AND PLAN:  1. Bilateral leg pain Recommend:  Based on the patient's noninvasive studies it is not likely that it is related to vascular reasons.  The patient also has extensive history of lower back pain as well.  However, the patient does have some visible varicosities so conservative management is urged as outlined below.  I have had a long discussion  with the patient regarding  varicose veins and why they cause symptoms.  Patient will begin wearing graduated compression stockings on a daily basis, beginning first thing in the morning and removing them in the evening. The patient is instructed specifically not to sleep in the stockings.    The patient  will also begin using over-the-counter analgesics such as Motrin 600 mg po TID to help control the symptoms as needed.    In addition, behavioral modification including elevation during the day will be initiated, utilizing a recliner was recommended.  The patient is also instructed to continue exercising such as walking 4-5 times per week.  At this time the patient wishes to continue conservative therapy and is not interested in more invasive treatments such as laser ablation and sclerotherapy.  The Patient will follow up PRN if the symptoms worsen.  2. Essential hypertension Continue antihypertensive medications as already ordered, these medications have been reviewed and there are no changes at this time.   3. Type 2 diabetes mellitus with diabetic polyneuropathy, with long-term current use of insulin (HCC) Continue hypoglycemic medications as already ordered, these medications have been reviewed and there are no changes at this time.  Hgb A1C to be monitored as already arranged by primary service   Current Outpatient  Medications on File Prior to Visit  Medication Sig Dispense Refill  . acetaminophen (TYLENOL) 500 MG tablet Take 2 tablets (1,000 mg total) by mouth every 6 (six) hours as needed for moderate pain or fever. 30 tablet 0  . aspirin 81 MG tablet Take 81 mg by mouth daily.    . Canagliflozin-Metformin HCl 50-500 MG TABS Take by mouth.    . cyclobenzaprine (FLEXERIL) 10 MG tablet Take 10 mg by mouth 3 (three) times daily as needed for muscle spasms.    . ergocalciferol (VITAMIN D2) 1.25 MG (50000 UT) capsule Take 50,000 Units by mouth once a week.    . fluticasone (FLONASE) 50 MCG/ACT nasal spray Place 1 spray into both nostrils 2 (two) times daily. 16 g 2  . gabapentin (NEURONTIN) 100 MG capsule TAKE ONE CAPSULE BY MOUTH TWICE DAILY FOR ONE WEEK THEN INCREASE TO TWO CAPSULES TWICE A DAY AND CONTINUE.    Marland Kitchen glucose blood test strip 1 each by Other route as needed for other. Use as instructed    . hydrochlorothiazide (HYDRODIURIL) 50 MG tablet Take 50 mg by mouth daily.    . insulin glargine (LANTUS) 100 UNIT/ML injection Inject into the skin at bedtime.    Marland Kitchen losartan (COZAAR) 100 MG tablet Take 100 mg by mouth daily.    . medroxyPROGESTERone (PROVERA) 10 MG tablet Take 20 mg by mouth daily.    . metoprolol tartrate (LOPRESSOR) 25 MG tablet Take 25 mg by mouth daily.    Marland Kitchen omeprazole (PRILOSEC) 40 MG capsule Take 40 mg by mouth daily.     . pregabalin (LYRICA) 75 MG capsule Take 75 mg by mouth 2 (two) times daily. 50 in am and 100 in pm    . Semaglutide (OZEMPIC, 0.25 OR 0.5 MG/DOSE, [place]) Inject 0.5 mg into the skin.    Marland Kitchen venlafaxine XR (EFFEXOR-XR) 75 MG 24 hr capsule Take 75 mg by mouth daily with breakfast.    . latanoprost (XALATAN) 0.005 % ophthalmic solution 1 drop at bedtime.     No current facility-administered medications on file prior to visit.     There are no Patient Instructions on file for this visit. Return if symptoms worsen or fail  to improve.   Kris Hartmann, NP  This note was  completed with Sales executive.  Any errors are purely unintentional.

## 2018-10-29 ENCOUNTER — Other Ambulatory Visit: Admission: RE | Admit: 2018-10-29 | Payer: Medicare Other | Source: Ambulatory Visit

## 2018-11-01 ENCOUNTER — Ambulatory Visit: Payer: Medicare Other | Attending: Neurology

## 2019-01-04 ENCOUNTER — Other Ambulatory Visit: Admission: RE | Admit: 2019-01-04 | Payer: Medicare Other | Source: Ambulatory Visit

## 2020-12-23 ENCOUNTER — Other Ambulatory Visit: Payer: Self-pay

## 2020-12-23 ENCOUNTER — Ambulatory Visit
Admission: EM | Admit: 2020-12-23 | Discharge: 2020-12-23 | Disposition: A | Discharge status: Home / Self Care | Payer: Medicare Other

## 2020-12-23 ENCOUNTER — Encounter: Payer: Self-pay | Admitting: Emergency Medicine

## 2020-12-23 DIAGNOSIS — J111 Influenza due to unidentified influenza virus with other respiratory manifestations: Secondary | ICD-10-CM | POA: Diagnosis not present

## 2020-12-23 MED ORDER — IPRATROPIUM BROMIDE 0.06 % NA SOLN: 2.0000 | Freq: Four times a day (QID) | NASAL | 12 refills | Status: DC

## 2020-12-23 MED ORDER — OSELTAMIVIR PHOSPHATE 75 MG PO CAPS: 75.0000 mg | ORAL_CAPSULE | Freq: Two times a day (BID) | ORAL | 0 refills | Status: DC

## 2020-12-23 MED ORDER — PROMETHAZINE-DM 6.25-15 MG/5ML PO SYRP: 5.0000 mL | ORAL_SOLUTION | Freq: Four times a day (QID) | ORAL | 0 refills | Status: DC | PRN

## 2020-12-23 MED ORDER — BENZONATATE 100 MG PO CAPS: 200.0000 mg | ORAL_CAPSULE | Freq: Three times a day (TID) | ORAL | 0 refills | Status: DC

## 2020-12-23 MED ORDER — ACETAMINOPHEN 500 MG PO TABS
1000.0000 mg | ORAL_TABLET | Freq: Once | ORAL | Status: AC
  Administered 2020-12-23: 1000 mg via ORAL

## 2020-12-23 NOTE — ED Provider Notes (Signed)
[facility]    CSN: [id number] Arrival date & time: 12/23/20  1752      History   Chief Complaint Chief Complaint  Patient presents with   Generalized Body Aches    HPI Rebecca Whitaker is a 56 y.o. female.   HPI  55 year old female here for evaluation of influenza symptoms.  Patient ports that she has been experiencing runny nose and nasal congestion, headache, body aches, sore throat, nonproductive cough, nausea, vomiting, and diarrhea since yesterday.  She denies any ear pain, shortness of breath or wheezing.  She reports a T-max of 101.4 at home.  Patient is 103.1 here in clinic.  Reports that her nephew has similar symptoms but she is unsure of what his illness might be.  Past Medical History:  Diagnosis Date   Carpal tunnel syndrome    Depression    Diabetes mellitus without complication ([place])    Hypertension    Obesity    Sleep apnea     Patient Active Problem List   Diagnosis Date Noted   Bilateral leg pain 09/13/2018   Memory difficulties 09/13/2018   Sleep difficulties 09/13/2018   DUB (dysfunctional uterine bleeding) 08/10/2018   Low back pain 07/03/2018   Numbness and tingling of both feet 07/03/2018   Pulmonary nodules 04/10/2018   Tobacco abuse counseling 04/10/2018   Right hand pain 03/06/2018   Obesity (BMI 35.0-39.9 without comorbidity) 02/07/2018   Dyslipidemia 12/11/2017   Low ferritin 12/11/2017   Secondary polycythemia 08/18/2017   Vitamin D deficiency 08/18/2017   Hyperlipidemia due to type 2 diabetes mellitus ([place]) 11/20/2016   Multinodular goiter 11/20/2016   Vulvar lesion 08/26/2016   Type 2 diabetes mellitus with diabetic polyneuropathy, with long-term current use of insulin ([place]) 08/15/2016   Rib pain on left side 08/11/2016   Left upper quadrant pain 07/15/2016   Left arm pain 04/23/2016   MS (multiple sclerosis) ([place]) 04/23/2016   Numbness and tingling in right hand 04/23/2016   Pelvic pain in female 02/23/2016    History of depression 01/21/2016   Numbness and tingling 12/10/2015   Fibroids, subserous 07/21/2015   Diabetic autonomic neuropathy associated with type 2 diabetes mellitus ([place]) 12/03/2013   Depression 09/24/2013   Obesity, morbid, BMI 40.0-49.9 ([place]) 04/17/2013   Tobacco abuse 04/17/2013   Pain in extremity 03/27/2013   OSA (obstructive sleep apnea) 02/20/2013   Hypertension 10/15/2012   Primary open-angle glaucoma 04/27/2011   Bilateral carpal tunnel syndrome 11/02/2010   Hidradenitis 09/29/2010    Past Surgical History:  Procedure Laterality Date   CESAREAN SECTION  x 4   LAPAROSCOPIC UNILATERAL SALPINGO OOPHERECTOMY Right     OB History   No obstetric history on file.      Home Medications    Prior to Admission medications   Medication Sig Start Date End Date Taking? Authorizing Provider  acetaminophen (TYLENOL) 500 MG tablet Take 2 tablets (1,000 mg total) by mouth every 6 (six) hours as needed for moderate pain or fever. 11/03/14  Yes Betancourt, Aura Fey, NP  benzonatate (TESSALON) 100 MG capsule Take 2 capsules (200 mg total) by mouth every 8 (eight) hours. 12/23/20  Yes Margarette Canada, NP  busPIRone (BUSPAR) 5 MG tablet Take by mouth. 12/18/20 01/17/21 Yes [provider]  Canagliflozin-Metformin HCl 50-500 MG TABS Take by mouth.   Yes [provider]  ergocalciferol (VITAMIN D2) 1.25 MG (50000 UT) capsule Take 50,000 Units by mouth once a week.   Yes [provider]  hydrochlorothiazide (HYDRODIURIL) 50 MG tablet Take 50 mg by mouth daily.   Yes [provider]  insulin glargine (LANTUS) 100 UNIT/ML injection Inject into the skin at bedtime.   Yes [provider]  ipratropium (ATROVENT) 0.06 % nasal spray Place 2 sprays into both nostrils 4 (four) times daily. 12/23/20  Yes Margarette Canada, NP  latanoprost (XALATAN) 0.005 % ophthalmic solution 1 drop at bedtime.   Yes [provider]  losartan (COZAAR) 100 MG tablet  Take 100 mg by mouth daily.   Yes [provider]  metoprolol tartrate (LOPRESSOR) 25 MG tablet Take 25 mg by mouth daily.   Yes [provider]  omeprazole (PRILOSEC) 40 MG capsule Take 40 mg by mouth daily.  02/10/18  Yes [provider]  oseltamivir (TAMIFLU) 75 MG capsule Take 1 capsule (75 mg total) by mouth every 12 (twelve) hours. 12/23/20  Yes Margarette Canada, NP  promethazine-dextromethorphan (PROMETHAZINE-DM) 6.25-15 MG/5ML syrup Take 5 mLs by mouth 4 (four) times daily as needed. 12/23/20  Yes Margarette Canada, NP  Semaglutide (OZEMPIC, 0.25 OR 0.5 MG/DOSE, [place]) Inject 0.5 mg into the skin.   Yes [provider]  venlafaxine XR (EFFEXOR-XR) 75 MG 24 hr capsule Take 75 mg by mouth daily with breakfast.   Yes [provider]  aspirin 81 MG tablet Take 81 mg by mouth daily.    [provider]  cyclobenzaprine (FLEXERIL) 10 MG tablet Take 10 mg by mouth 3 (three) times daily as needed for muscle spasms.    [provider]  glucose blood test strip 1 each by Other route as needed for other. Use as instructed    [provider]    Family History No family history on file.  Social History Social History   Tobacco Use   Smoking status: Every Day    Packs/day: 1.00    Types: Cigarettes   Smokeless tobacco: Never  Vaping Use   Vaping Use: Never used  Substance Use Topics   Alcohol use: No   Drug use: No     Allergies   Clindamycin, Diphenhydramine hcl (sleep), Succinylcholine, Clindamycin/lincomycin, Diphenhydramine hcl, Gabapentin, Gentamycin [gentamicin], Ibuprofen, Lincomycin, Meperidine and related, Nsaids, Penicillins, Pertussis vaccines, Pravastatin, Propranolol, and Simvastatin   Review of Systems Review of Systems  Constitutional:  Positive for fever. Negative for activity change and appetite change.  HENT:  Positive for congestion, rhinorrhea and sore throat. Negative for ear pain.   Respiratory:  Positive  for cough. Negative for shortness of breath and wheezing.   Gastrointestinal:  Positive for diarrhea, nausea and vomiting.  Musculoskeletal:  Positive for myalgias.  Skin:  Negative for rash.  Neurological:  Positive for headaches.  Hematological: Negative.   Psychiatric/Behavioral: Negative.      Physical Exam Triage Vital Signs ED Triage Vitals  Enc Vitals Group     BP 12/23/20 1834 (!) 145/86     Pulse Rate 12/23/20 1834 (!) 129     Resp 12/23/20 1834 18     Temp 12/23/20 1834 (!) 103.1 F (39.5 C)     Temp Source 12/23/20 1834 Oral     SpO2 12/23/20 1834 97 %     Weight 12/23/20 1830 205 lb 14.6 oz (93.4 kg)     Height 12/23/20 1830 5\' 3"  (1.6 m)     Head Circumference --      Peak Flow --      Pain Score 12/23/20 1828 9     Pain Loc --  Pain Edu? --      Excl. in [place]? --    No data found.  Updated Vital Signs BP (!) 145/86 (BP Location: Left Arm)   Pulse (!) 129   Temp (!) 103.1 F (39.5 C) (Oral)   Resp 18   Ht 5\' 3"  (1.6 m)   Wt 205 lb 14.6 oz (93.4 kg)   LMP 01/24/2018   SpO2 97%   BMI 36.48 kg/m   Visual Acuity Right Eye Distance:   Left Eye Distance:   Bilateral Distance:    Right Eye Near:   Left Eye Near:    Bilateral Near:     Physical Exam Vitals and nursing note reviewed.  Constitutional:      General: She is not in acute distress.    Appearance: Normal appearance. She is normal weight. She is ill-appearing.  HENT:     Head: Normocephalic and atraumatic.     Right Ear: Tympanic membrane, ear canal and external ear normal. There is no impacted cerumen.     Left Ear: Tympanic membrane, ear canal and external ear normal. There is no impacted cerumen.     Nose: Congestion and rhinorrhea present.     Mouth/Throat:     Mouth: Mucous membranes are moist.     Pharynx: Oropharynx is clear. Posterior oropharyngeal erythema present.  Cardiovascular:     Rate and Rhythm: Normal rate and regular rhythm.     Pulses: Normal pulses.     Heart  sounds: Normal heart sounds. No murmur heard.   No gallop.  Pulmonary:     Effort: Pulmonary effort is normal.     Breath sounds: Normal breath sounds. No wheezing, rhonchi or rales.  Musculoskeletal:     Cervical back: Normal range of motion and neck supple.  Lymphadenopathy:     Cervical: No cervical adenopathy.  Skin:    General: Skin is warm and dry.     Capillary Refill: Capillary refill takes less than 2 seconds.     Findings: No bruising, erythema or rash.  Neurological:     General: No focal deficit present.     Mental Status: She is alert and oriented to person, place, and time.  Psychiatric:        Mood and Affect: Mood normal.        Behavior: Behavior normal.        Thought Content: Thought content normal.        Judgment: Judgment normal.     UC Treatments / Results  Labs (all labs ordered are listed, but only abnormal results are displayed) Labs Reviewed  RESP PANEL BY RT-PCR (FLU A&B, COVID) [id number]    EKG   Radiology No results found.  Procedures Procedures (including critical care time)  Medications Ordered in UC Medications  acetaminophen (TYLENOL) tablet 1,000 mg (1,000 mg Oral Given 12/23/20 1837)    Initial Impression / Assessment and Plan / UC Course  I have reviewed the triage vital signs and the nursing notes.  Pertinent labs & imaging results that were available during my care of the patient were reviewed by me and considered in my medical decision making (see chart for details).  Patient is a pleasant though ill-appearing 55 year old female here for evaluation of respiratory complaints as outlined in HPI above.  Patient's physical exam reveals protegrin tympanic membranes bilaterally with normal light reflex and clear external auditory canals.  Nasal mucosa is erythematous and edematous with clear nasal discharge in both nares.  Oropharyngeal exam  reveals posterior oropharyngeal erythema and injection with clear postnasal drip.  No cervical  lymphadenopathy appreciated on exam.  Cardiopulmonary exam reveals clear lung sounds in all fields.  Deep breathing does trigger a cough in the patient.  With patient's high fever and respiratory symptoms I suspect the patient has influenza and will treat her accordingly.  I have prescribed Tamiflu twice daily for 5 days, Atrovent nasal spray to have the nasal congestion, Tessalon Perles and Promethazine DM cough syrup to help with cough and congestion.   Final Clinical Impressions(s) / UC Diagnoses   Final diagnoses:  Influenza     Discharge Instructions      Take the Tamiflu twice daily for 5 days for treatment of influenza.  Use the Atrovent nasal spray, 2 squirts up each nostril every 6 hours, as needed for nasal congestion and runny nose.  Use over-the-counter Delsym, Zarbee's, or Robitussin during the day as needed for cough.  Use the Tessalon Perles every 8 hours as needed for cough.  Taken with a small sip of water.  You may experience some numbness to your tongue or metallic taste in her mouth, this is normal.  Use the Promethazine DM cough syrup at bedtime as will make you drowsy but it should help dry up your postnasal drip and aid you in sleep and cough relief.  Return for reevaluation, or see your primary care provider, for new or worsening symptoms.      ED Prescriptions     Medication Sig Dispense Auth. Provider   oseltamivir (TAMIFLU) 75 MG capsule Take 1 capsule (75 mg total) by mouth every 12 (twelve) hours. 10 capsule Margarette Canada, NP   benzonatate (TESSALON) 100 MG capsule Take 2 capsules (200 mg total) by mouth every 8 (eight) hours. 21 capsule Margarette Canada, NP   ipratropium (ATROVENT) 0.06 % nasal spray Place 2 sprays into both nostrils 4 (four) times daily. 15 mL Margarette Canada, NP   promethazine-dextromethorphan (PROMETHAZINE-DM) 6.25-15 MG/5ML syrup Take 5 mLs by mouth 4 (four) times daily as needed. 118 mL Margarette Canada, NP      PDMP not reviewed this  encounter.   Margarette Canada, NP 12/23/20 1905

## 2020-12-23 NOTE — Discharge Instructions (Signed)
Take the Tamiflu twice daily for 5 days for treatment of influenza.  Use the Atrovent nasal spray, 2 squirts up each nostril every 6 hours, as needed for nasal congestion and runny nose.  Use over-the-counter Delsym, Zarbee's, or Robitussin during the day as needed for cough.  Use the Tessalon Perles every 8 hours as needed for cough.  Taken with a small sip of water.  You may experience some numbness to your tongue or metallic taste in her mouth, this is normal.  Use the Promethazine DM cough syrup at bedtime as will make you drowsy but it should help dry up your postnasal drip and aid you in sleep and cough relief.  Return for reevaluation, or see your primary care provider, for new or worsening symptoms.

## 2020-12-23 NOTE — ED Triage Notes (Signed)
Pt c/o body aches, cough, fever (101.4), nasal congestion, runny nose. Started yesterday.

## 2021-01-04 ENCOUNTER — Ambulatory Visit (INDEPENDENT_AMBULATORY_CARE_PROVIDER_SITE_OTHER): Payer: Medicare Other

## 2021-01-04 ENCOUNTER — Other Ambulatory Visit: Payer: Self-pay

## 2021-01-04 ENCOUNTER — Ambulatory Visit
Admission: EM | Admit: 2021-01-04 | Discharge: 2021-01-04 | Disposition: A | Discharge status: Home / Self Care | Payer: Medicare Other | Attending: Internal Medicine | Admitting: Internal Medicine

## 2021-01-04 DIAGNOSIS — R1011 Right upper quadrant pain: Secondary | ICD-10-CM | POA: Insufficient documentation

## 2021-01-04 DIAGNOSIS — R062 Wheezing: Secondary | ICD-10-CM

## 2021-01-04 DIAGNOSIS — R109 Unspecified abdominal pain: Secondary | ICD-10-CM | POA: Diagnosis not present

## 2021-01-04 DIAGNOSIS — R0789 Other chest pain: Secondary | ICD-10-CM | POA: Insufficient documentation

## 2021-01-04 DIAGNOSIS — K5901 Slow transit constipation: Secondary | ICD-10-CM | POA: Diagnosis present

## 2021-01-04 DIAGNOSIS — J209 Acute bronchitis, unspecified: Secondary | ICD-10-CM | POA: Insufficient documentation

## 2021-01-04 LAB — COMPREHENSIVE METABOLIC PANEL
ALT: 25 U/L (ref 0–44)
AST: 17 U/L (ref 15–41)
Albumin: 3.8 g/dL (ref 3.5–5.0)
Alkaline Phosphatase: 69 U/L (ref 38–126)
Anion gap: 6 (ref 5–15)
BUN: 23 mg/dL — ABNORMAL HIGH (ref 6–20)
CO2: 27 mmol/L (ref 22–32)
Calcium: 8.9 mg/dL (ref 8.9–10.3)
Chloride: 101 mmol/L (ref 98–111)
Creatinine, Ser: 0.66 mg/dL (ref 0.44–1.00)
GFR, Estimated: >60 mL/min (ref >60)
Glucose, Bld: 172 mg/dL — ABNORMAL HIGH (ref 70–99)
Potassium: 3.8 mmol/L (ref 3.5–5.1)
Sodium: 134 mmol/L — ABNORMAL LOW (ref 135–145)
Total Bilirubin: 0.6 mg/dL (ref 0.3–1.2)
Total Protein: 8.1 g/dL (ref 6.5–8.1)

## 2021-01-04 LAB — CBC WITH DIFFERENTIAL/PLATELET
Abs Immature Granulocytes: 0.04 10*3/uL (ref 0.00–0.07)
Basophils Absolute: 0.1 10*3/uL (ref 0.0–0.1)
Basophils Relative: 1 %
Eosinophils Absolute: 0.1 10*3/uL (ref 0.0–0.5)
Eosinophils Relative: 1 %
HCT: 46 % (ref 36.0–46.0)
Hemoglobin: 15.1 g/dL — ABNORMAL HIGH (ref 12.0–15.0)
Immature Granulocytes: 1 %
Lymphocytes Relative: 27 %
Lymphs Abs: 2 10*3/uL (ref 0.7–4.0)
MCH: 27.2 pg (ref 26.0–34.0)
MCHC: 32.8 g/dL (ref 30.0–36.0)
MCV: 82.7 fL (ref 80.0–100.0)
Monocytes Absolute: 0.5 10*3/uL (ref 0.1–1.0)
Monocytes Relative: 7 %
Neutro Abs: 4.6 10*3/uL (ref 1.7–7.7)
Neutrophils Relative %: 63 %
Platelets: 430 10*3/uL — ABNORMAL HIGH (ref 150–400)
RBC: 5.56 MIL/uL — ABNORMAL HIGH (ref 3.87–5.11)
RDW: 15 % (ref 11.5–15.5)
WBC: 7.2 10*3/uL (ref 4.0–10.5)
nRBC: 0 % (ref 0.0–0.2)

## 2021-01-04 MED ORDER — LIDOCAINE VISCOUS HCL 2 % MT SOLN
15.0000 mL | Freq: Once | OROMUCOSAL | Status: AC
  Administered 2021-01-04: 15 mL via ORAL

## 2021-01-04 MED ORDER — PREDNISONE 20 MG PO TABS: 20.0000 mg | ORAL_TABLET | Freq: Every day | ORAL | 0 refills | Status: DC

## 2021-01-04 MED ORDER — ALUM & MAG HYDROXIDE-SIMETH 200-200-20 MG/5ML PO SUSP
30.0000 mL | Freq: Once | ORAL | Status: AC
  Administered 2021-01-04: 30 mL via ORAL

## 2021-01-04 MED ORDER — DOXYCYCLINE HYCLATE 100 MG PO CAPS: 100.0000 mg | ORAL_CAPSULE | Freq: Two times a day (BID) | ORAL | 0 refills | Status: DC

## 2021-01-04 NOTE — ED Provider Notes (Signed)
[facility]    CSN: [id number] Arrival date & time: 01/04/21  1241      History   Chief Complaint Chief Complaint  Patient presents with   Shortness of Breath   Fatigue    HPI Rebecca Whitaker is a 55 y.o. female who presents due to cough and chest congestion getting worse since she was diagnosed with Flu 11/23. She has been having low substernal tightness radiating to her RUQ. The chest tightness gets better when she uses her inhaler. Has hx of slow motility constipation. Has been on Ozempic for 1 y. She admits of being a smoker. Does not know if she has had a fever lately, but has been sweating. She does not check her glucose, but her A1c stays around 6.     Past Medical History:  Diagnosis Date   Carpal tunnel syndrome    Depression    Diabetes mellitus without complication ([place])    Hypertension    Obesity    Sleep apnea     Patient Active Problem List   Diagnosis Date Noted   Bilateral leg pain 09/13/2018   Memory difficulties 09/13/2018   Sleep difficulties 09/13/2018   DUB (dysfunctional uterine bleeding) 08/10/2018   Low back pain 07/03/2018   Numbness and tingling of both feet 07/03/2018   Pulmonary nodules 04/10/2018   Tobacco abuse counseling 04/10/2018   Right hand pain 03/06/2018   Obesity (BMI 35.0-39.9 without comorbidity) 02/07/2018   Dyslipidemia 12/11/2017   Low ferritin 12/11/2017   Secondary polycythemia 08/18/2017   Vitamin D deficiency 08/18/2017   Hyperlipidemia due to type 2 diabetes mellitus ([place]) 11/20/2016   Multinodular goiter 11/20/2016   Vulvar lesion 08/26/2016   Type 2 diabetes mellitus with diabetic polyneuropathy, with long-term current use of insulin ([place]) 08/15/2016   Rib pain on left side 08/11/2016   Left upper quadrant pain 07/15/2016   Left arm pain 04/23/2016   MS (multiple sclerosis) ([place]) 04/23/2016   Numbness and tingling in right hand 04/23/2016   Pelvic pain in female 02/23/2016   History of  depression 01/21/2016   Numbness and tingling 12/10/2015   Fibroids, subserous 07/21/2015   Diabetic autonomic neuropathy associated with type 2 diabetes mellitus ([place]) 12/03/2013   Depression 09/24/2013   Obesity, morbid, BMI 40.0-49.9 ([place]) 04/17/2013   Tobacco abuse 04/17/2013   Pain in extremity 03/27/2013   OSA (obstructive sleep apnea) 02/20/2013   Hypertension 10/15/2012   Primary open-angle glaucoma 04/27/2011   Bilateral carpal tunnel syndrome 11/02/2010   Hidradenitis 09/29/2010    Past Surgical History:  Procedure Laterality Date   CESAREAN SECTION  x 4   LAPAROSCOPIC UNILATERAL SALPINGO OOPHERECTOMY Right     OB History   No obstetric history on file.      Home Medications    Prior to Admission medications   Medication Sig Start Date End Date Taking? Authorizing Provider  doxycycline (VIBRAMYCIN) 100 MG capsule Take 1 capsule (100 mg total) by mouth 2 (two) times daily. 01/04/21  Yes Rodriguez-Southworth, Sunday Spillers, PA-C  predniSONE (DELTASONE) 20 MG tablet Take 1 tablet (20 mg total) by mouth daily with breakfast. 01/04/21  Yes Rodriguez-Southworth, Sunday Spillers, PA-C  acetaminophen (TYLENOL) 500 MG tablet Take 2 tablets (1,000 mg total) by mouth every 6 (six) hours as needed for moderate pain or fever. 11/03/14   Betancourt, Aura Fey, NP  aspirin 81 MG tablet Take 81 mg by mouth daily.    [provider]  benzonatate (TESSALON) 100 MG capsule Take 2  capsules (200 mg total) by mouth every 8 (eight) hours. 12/23/20   Margarette Canada, NP  busPIRone (BUSPAR) 5 MG tablet Take by mouth. 12/18/20 01/17/21  [provider]  Canagliflozin-Metformin HCl 50-500 MG TABS Take by mouth.    [provider]  cyclobenzaprine (FLEXERIL) 10 MG tablet Take 10 mg by mouth 3 (three) times daily as needed for muscle spasms.    [provider]  ergocalciferol (VITAMIN D2) 1.25 MG (50000 UT) capsule Take 50,000 Units by mouth once a week.    [provider]   glucose blood test strip 1 each by Other route as needed for other. Use as instructed    [provider]  hydrochlorothiazide (HYDRODIURIL) 50 MG tablet Take 50 mg by mouth daily.    [provider]  insulin glargine (LANTUS) 100 UNIT/ML injection Inject into the skin at bedtime.    [provider]  ipratropium (ATROVENT) 0.06 % nasal spray Place 2 sprays into both nostrils 4 (four) times daily. 12/23/20   Margarette Canada, NP  latanoprost (XALATAN) 0.005 % ophthalmic solution 1 drop at bedtime.    [provider]  losartan (COZAAR) 100 MG tablet Take 100 mg by mouth daily.    [provider]  metoprolol tartrate (LOPRESSOR) 25 MG tablet Take 25 mg by mouth daily.    [provider]  omeprazole (PRILOSEC) 40 MG capsule Take 40 mg by mouth daily.  02/10/18   [provider]  Semaglutide (OZEMPIC, 0.25 OR 0.5 MG/DOSE, [place]) Inject 0.5 mg into the skin.    [provider]  venlafaxine XR (EFFEXOR-XR) 75 MG 24 hr capsule Take 75 mg by mouth daily with breakfast.    [provider]    Family History No family history on file.  Social History Social History   Tobacco Use   Smoking status: Every Day    Packs/day: 1.00    Types: Cigarettes   Smokeless tobacco: Never  Vaping Use   Vaping Use: Never used  Substance Use Topics   Alcohol use: No   Drug use: No     Allergies   Clindamycin, Diphenhydramine hcl (sleep), Succinylcholine, Clindamycin/lincomycin, Diphenhydramine hcl, Gabapentin, Gentamycin [gentamicin], Ibuprofen, Lincomycin, Meperidine and related, Nsaids, Penicillins, Pertussis vaccines, Pravastatin, Propranolol, and Simvastatin   Review of Systems Review of Systems  Constitutional:  Positive for diaphoresis and fatigue. Negative for appetite change, chills and fever.  HENT:  Positive for postnasal drip. Negative for congestion, ear discharge, ear pain, rhinorrhea and sore throat.   Eyes:  Negative for  discharge.  Respiratory:  Positive for cough, shortness of breath and wheezing. Negative for chest tightness.   Cardiovascular:  Negative for chest pain.  Gastrointestinal:  Positive for abdominal distention, abdominal pain and constipation. Negative for nausea and vomiting.  Genitourinary:  Negative for dysuria, flank pain and urgency.  Musculoskeletal:  Negative for myalgias.  Skin:  Negative for rash.  Hematological:  Negative for adenopathy.    Physical Exam Triage Vital Signs ED Triage Vitals  Enc Vitals Group     BP 01/04/21 1301 (!) 142/85     Pulse Rate 01/04/21 1301 (!) 106     Resp 01/04/21 1301 16     Temp 01/04/21 1301 98.3 F (36.8 C)     Temp Source 01/04/21 1301 Oral     SpO2 01/04/21 1301 96 %     Weight 01/04/21 1259 205 lb 14.6 oz (93.4 kg)     Height 01/04/21 1259 5\' 3"  (1.6 m)  Head Circumference --      Peak Flow --      Pain Score 01/04/21 1259 0     Pain Loc --      Pain Edu? --      Excl. in [place]? --    No data found.  Updated Vital Signs BP (!) 142/85 (BP Location: Left Arm)   Pulse (!) 106   Temp 98.3 F (36.8 C) (Oral)   Resp 16   Ht 5\' 3"  (1.6 m)   Wt 205 lb 14.6 oz (93.4 kg)   LMP 01/24/2018   SpO2 96%   BMI 36.48 kg/m   Visual Acuity Right Eye Distance:   Left Eye Distance:   Bilateral Distance:    Right Eye Near:   Left Eye Near:    Bilateral Near:     Physical Exam Constitutional:      General: She is not in acute distress.    Appearance: She is obese. She is not toxic-appearing.  HENT:     Right Ear: Tympanic membrane, ear canal and external ear normal.     Left Ear: Tympanic membrane, ear canal and external ear normal.     Mouth/Throat:     Mouth: Mucous membranes are moist.     Pharynx: Oropharynx is clear.  Eyes:     General: No scleral icterus.    Conjunctiva/sclera: Conjunctivae normal.  Cardiovascular:     Rate and Rhythm: Regular rhythm. Tachycardia present.     Heart sounds: No murmur heard. Pulmonary:      Effort: Pulmonary effort is normal.     Breath sounds: Wheezing present.     Comments: Mild wheezing noted  Has tenderness on xyphoid region which reproduced the pain she has been having  Chest:     Chest wall: Tenderness present.  Abdominal:     General: There is distension.     Palpations: There is no mass.     Tenderness: There is abdominal tenderness. There is guarding.     Comments: RUQ  Musculoskeletal:        General: Normal range of motion.     Cervical back: Neck supple.  Lymphadenopathy:     Cervical: No cervical adenopathy.  Skin:    General: Skin is warm and dry.     Findings: No rash.  Neurological:     Mental Status: She is alert and oriented to person, place, and time.     Gait: Gait normal.  Psychiatric:        Mood and Affect: Mood normal.        Behavior: Behavior normal.        Thought Content: Thought content normal.        Judgment: Judgment normal.     UC Treatments / Results  Labs (all labs ordered are listed, but only abnormal results are displayed) Labs Reviewed  CBC WITH DIFFERENTIAL/PLATELET - Abnormal; Notable for the following components:      Result Value   RBC 5.56 (*)    Hemoglobin 15.1 (*)    Platelets 430 (*)    All other components within normal limits  COMPREHENSIVE METABOLIC PANEL - Abnormal; Notable for the following components:   Sodium 134 (*)    Glucose, Bld 172 (*)    BUN 23 (*)    All other components within normal limits  CBG MONITORING, ED    EKG NSR Possible L atrial enlargement. Inferior infarct, age undetermined  Abnormal EKG. Unable to find an old one  for comparison.   Radiology DG Chest 2 View  Result Date: 01/04/2021 CLINICAL DATA:  And wheezing.  Abdominal tightness.  Diaphoresis. EXAM: CHEST - 2 VIEW COMPARISON:  06/29/2016. FINDINGS: Trachea is midline. Heart size stable. Lungs are clear. No pleural fluid. IMPRESSION: No acute findings. Electronically Signed   By: Lorin Picket M.D.   On: 01/04/2021 13:51    DG Abd 1 View  Result Date: 01/04/2021 CLINICAL DATA:  Right upper quadrant pain and tightness. EXAM: ABDOMEN - 1 VIEW COMPARISON:  None. FINDINGS: Fair amount of stool is seen in the colon. No small bowel dilatation. No unexpected radiopaque calculi. IMPRESSION: Fair amount of stool in the colon is indicative of constipation. Electronically Signed   By: Lorin Picket M.D.   On: 01/04/2021 15:32    Procedures Procedures (including critical care time)  Medications Ordered in UC Medications  alum & mag hydroxide-simeth (MAALOX/MYLANTA) 200-200-20 MG/5ML suspension 30 mL (30 mLs Oral Given 01/04/21 1431)    And  lidocaine (XYLOCAINE) 2 % viscous mouth solution 15 mL (15 mLs Oral Given 01/04/21 1432)    Initial Impression / Assessment and Plan / UC Course  I have reviewed the triage vital signs and the nursing notes. Pertinent labs & imaging results that were available during my care of the patient were reviewed by me and considered in my medical decision making (see chart for details). She was given GI cocktail and did not help her abdominal symptoms Has Bronchitis, abdominal pain more likely from constipation, xyphoid inflammation, could be from coughing. I placed her on Dopxy, prednisone as noted Advised to drink a bottle of Citric mag to help her empty out her bowels.  See instructions. Needs to FU with PCP. A copy of her EKG was handed to her to take to her PCP.     Final Clinical Impressions(s) / UC Diagnoses   Final diagnoses:  Acute bronchitis, unspecified organism  RUQ pain  Sternal pain  Slow transit constipation     Discharge Instructions      You abdominal xray shows a large amount of stool, and worse on the R upper abdomen. Get Magnesium Citrate, and drink the whole bottle to help you empty out. Also for prevention drink 4-8 onz of prune juice every morning, and to help stool move down, you can get Ginger capsules 300 mg and take it 1-3 times a day.  Your white  cell count is normal, and your liver and kidney function are normal The chest xray does not show any pneumonia.   Please follow up with your primary care doctor this week. I dont have an old EKG to compare the one from today, so please take the copy I am giving you to them If the abdominal tightness gets worse, you need to go to the ER to have more test that we cant do here. Also your glucose is elevated to 172 and could be why you are sweating. Talk with your provider about getting a glucose monitor to check them.      ED Prescriptions     Medication Sig Dispense Auth. Provider   doxycycline (VIBRAMYCIN) 100 MG capsule Take 1 capsule (100 mg total) by mouth 2 (two) times daily. 20 capsule Rodriguez-Southworth, Sunday Spillers, PA-C   predniSONE (DELTASONE) 20 MG tablet Take 1 tablet (20 mg total) by mouth daily with breakfast. 5 tablet Rodriguez-Southworth, Sunday Spillers, PA-C      PDMP not reviewed this encounter.   Shelby Mattocks, Hershal Coria 01/04/21 1835

## 2021-01-04 NOTE — Discharge Instructions (Addendum)
You abdominal xray shows a large amount of stool, and worse on the R upper abdomen. Get Magnesium Citrate, and drink the whole bottle to help you empty out. Also for prevention drink 4-8 onz of prune juice every morning, and to help stool move down, you can get Ginger capsules 300 mg and take it 1-3 times a day.  Your white cell count is normal, and your liver and kidney function are normal The chest xray does not show any pneumonia.   Please follow up with your primary care doctor this week. I dont have an old EKG to compare the one from today, so please take the copy I am giving you to them If the abdominal tightness gets worse, you need to go to the ER to have more test that we cant do here. Also your glucose is elevated to 172 and could be why you are sweating. Talk with your provider about getting a glucose monitor to check them.

## 2021-01-04 NOTE — ED Triage Notes (Signed)
Pt c/o tightness around abdomen and in sternum, sweating and hot, patient states she feels like she is burning up. Cough and congestion, not feeling better since flu dx on 11/23.

## 2021-06-20 ENCOUNTER — Ambulatory Visit
Admission: EM | Admit: 2021-06-20 | Discharge: 2021-06-20 | Disposition: A | Discharge status: Home / Self Care | Payer: Medicare Other

## 2021-06-20 ENCOUNTER — Other Ambulatory Visit: Payer: Self-pay

## 2021-06-20 DIAGNOSIS — B001 Herpesviral vesicular dermatitis: Secondary | ICD-10-CM

## 2021-06-20 DIAGNOSIS — D509 Iron deficiency anemia, unspecified: Secondary | ICD-10-CM

## 2021-06-20 DIAGNOSIS — L02213 Cutaneous abscess of chest wall: Secondary | ICD-10-CM

## 2021-06-20 DIAGNOSIS — R5383 Other fatigue: Secondary | ICD-10-CM | POA: Diagnosis not present

## 2021-06-20 MED ORDER — DOXYCYCLINE HYCLATE 100 MG PO CAPS: 100.0000 mg | ORAL_CAPSULE | Freq: Two times a day (BID) | ORAL | 0 refills | Status: AC

## 2021-06-20 MED ORDER — ACYCLOVIR 5 % EX CREA: 1.0000 "application " | TOPICAL_CREAM | CUTANEOUS | 0 refills | Status: AC

## 2021-06-20 NOTE — ED Triage Notes (Signed)
Patient c/o not having any energy -- started Thursday.   Patient denies chills, Fevers, N&V.

## 2021-06-20 NOTE — ED Provider Notes (Signed)
[facility]    CSN: [id number] Arrival date & time: 06/20/21  1432      History   Chief Complaint Chief Complaint  Patient presents with   Fatigue    HPI Rebecca Whitaker is a 56 y.o. female presenting for fatigue for 4 days.  History of OSA on CPAP, diabetes, multiple sclerosis.  Patient states that she has been very tired for the last 4 days, she does not have any other symptoms, including shortness of breath, chest pain, dizziness, weakness, abdominal pain, dysuria, hematuria, frequency, vaginal irritation.  She has no specific concerns related to this.  She is followed closely by her neurologist and her primary care provider, last neurology appointment was 06/09/2021, last PCP appointment was 05/19/2021.  She is using her CPAP as directed and taking her chronic medications as directed.  She does incidentally mention an abscess on the anterior chest wall that she popped at home, it is getting better on its own.  She also notes a cold sore on the left upper lip for the last 3 days, she has never had a cold sore before.  She has not attempted any interventions at home. States she is sleeping well.  Denies worst headache of life, thunderclap headache, weakness/sensation changes in arms/legs, vision changes, shortness of breath, chest pain/pressure, photophobia, phonophobia, n/v/d.     HPI  Past Medical History:  Diagnosis Date   Carpal tunnel syndrome    Depression    Diabetes mellitus without complication ([place])    Hypertension    Obesity    Sleep apnea     Patient Active Problem List   Diagnosis Date Noted   Bilateral leg pain 09/13/2018   Memory difficulties 09/13/2018   Sleep difficulties 09/13/2018   DUB (dysfunctional uterine bleeding) 08/10/2018   Low back pain 07/03/2018   Numbness and tingling of both feet 07/03/2018   Pulmonary nodules 04/10/2018   Tobacco abuse counseling 04/10/2018   Right hand pain 03/06/2018   Obesity (BMI 35.0-39.9 without  comorbidity) 02/07/2018   Dyslipidemia 12/11/2017   Low ferritin 12/11/2017   Secondary polycythemia 08/18/2017   Vitamin D deficiency 08/18/2017   Hyperlipidemia due to type 2 diabetes mellitus ([place]) 11/20/2016   Multinodular goiter 11/20/2016   Vulvar lesion 08/26/2016   Type 2 diabetes mellitus with diabetic polyneuropathy, with long-term current use of insulin ([place]) 08/15/2016   Rib pain on left side 08/11/2016   Left upper quadrant pain 07/15/2016   Left arm pain 04/23/2016   MS (multiple sclerosis) ([place]) 04/23/2016   Numbness and tingling in right hand 04/23/2016   Pelvic pain in female 02/23/2016   History of depression 01/21/2016   Numbness and tingling 12/10/2015   Fibroids, subserous 07/21/2015   Diabetic autonomic neuropathy associated with type 2 diabetes mellitus ([place]) 12/03/2013   Depression 09/24/2013   Obesity, morbid, BMI 40.0-49.9 ([place]) 04/17/2013   Tobacco abuse 04/17/2013   Pain in extremity 03/27/2013   OSA (obstructive sleep apnea) 02/20/2013   Hypertension 10/15/2012   Primary open-angle glaucoma 04/27/2011   Bilateral carpal tunnel syndrome 11/02/2010   Hidradenitis 09/29/2010    Past Surgical History:  Procedure Laterality Date   CESAREAN SECTION  x 4   LAPAROSCOPIC UNILATERAL SALPINGO OOPHERECTOMY Right     OB History   No obstetric history on file.      Home Medications    Prior to Admission medications   Medication Sig Start Date End Date Taking? Authorizing Provider  acetaminophen (TYLENOL) 500 MG tablet Take  2 tablets (1,000 mg total) by mouth every 6 (six) hours as needed for moderate pain or fever. 11/03/14  Yes Betancourt, Aura Fey, NP  acyclovir cream (ZOVIRAX) 5 % Apply 1 application. topically every 3 (three) hours for 7 days. 06/20/21 06/27/21 Yes Hazel Sams, PA-C  cyclobenzaprine (FLEXERIL) 10 MG tablet Take 10 mg by mouth 3 (three) times daily as needed for muscle spasms.   Yes [provider]  doxycycline (VIBRAMYCIN)  100 MG capsule Take 1 capsule (100 mg total) by mouth 2 (two) times daily for 7 days. 06/20/21 06/27/21 Yes Hazel Sams, PA-C  DULoxetine (CYMBALTA) 30 MG capsule Take 30 mg by mouth daily. 05/19/21  Yes [provider]  ezetimibe (ZETIA) 10 MG tablet Take 10 mg by mouth daily. 06/06/21  Yes [provider]  glucose blood test strip 1 each by Other route as needed for other. Use as instructed   Yes [provider]  hydrochlorothiazide (HYDRODIURIL) 50 MG tablet Take 50 mg by mouth daily.   Yes [provider]  insulin glargine (LANTUS) 100 UNIT/ML injection Inject into the skin at bedtime.   Yes [provider]  latanoprost (XALATAN) 0.005 % ophthalmic solution 1 drop at bedtime.   Yes [provider]  levocetirizine (XYZAL) 5 MG tablet Take 1 tablet by mouth every evening. 05/14/21 05/14/22 Yes [provider]  losartan (COZAAR) 100 MG tablet Take 100 mg by mouth daily.   Yes [provider]  metoprolol tartrate (LOPRESSOR) 25 MG tablet Take 25 mg by mouth daily.   Yes [provider]  Semaglutide (OZEMPIC, 0.25 OR 0.5 MG/DOSE, [place]) Inject 0.5 mg into the skin.   Yes [provider]  SYNJARDY XR 25-1000 MG TB24 Take 1 tablet by mouth daily. 06/06/21  Yes [provider]  venlafaxine XR (EFFEXOR-XR) 75 MG 24 hr capsule Take 75 mg by mouth daily with breakfast.   Yes [provider]    Family History History reviewed. No pertinent family history.  Social History Social History   Tobacco Use   Smoking status: Every Day    Packs/day: 1.00    Types: Cigarettes   Smokeless tobacco: Never  Vaping Use   Vaping Use: Never used  Substance Use Topics   Alcohol use: No   Drug use: No     Allergies   Clindamycin, Diphenhydramine hcl (sleep), Succinylcholine, Clindamycin/lincomycin, Diphenhydramine hcl, Gabapentin, Gentamycin [gentamicin], Ibuprofen, Lincomycin, Meperidine and related, Nsaids,  Penicillins, Pertussis vaccines, Pravastatin, Propranolol, and Simvastatin   Review of Systems Review of Systems  Constitutional:  Positive for fatigue.  All other systems reviewed and are negative.   Physical Exam Triage Vital Signs ED Triage Vitals  Enc Vitals Group     BP 06/20/21 1452 130/82     Pulse Rate 06/20/21 1452 99     Resp 06/20/21 1452 20     Temp 06/20/21 1452 98.2 F (36.8 C)     Temp Source 06/20/21 1452 Oral     SpO2 06/20/21 1452 97 %     Weight 06/20/21 1446 198 lb (89.8 kg)     Height 06/20/21 1446 '5\' 4"'$  (1.626 m)     Head Circumference --      Peak Flow --      Pain Score 06/20/21 1446 6     Pain Loc --      Pain Edu? --      Excl. in [place]? --    No data found.  Updated Vital  Signs BP 130/82 (BP Location: Left Arm)   Pulse 99   Temp 98.2 F (36.8 C) (Oral)   Resp 20   Ht '5\' 4"'$  (1.626 m)   Wt 198 lb (89.8 kg)   LMP 01/24/2018   SpO2 97%   BMI 33.99 kg/m   Visual Acuity Right Eye Distance:   Left Eye Distance:   Bilateral Distance:    Right Eye Near:   Left Eye Near:    Bilateral Near:     Physical Exam Vitals reviewed.  Constitutional:      Appearance: Normal appearance. She is not diaphoretic.  HENT:     Head: Normocephalic and atraumatic.     Mouth/Throat:     Mouth: Mucous membranes are moist.  Eyes:     Extraocular Movements: Extraocular movements intact.     Pupils: Pupils are equal, round, and reactive to light.  Cardiovascular:     Rate and Rhythm: Normal rate and regular rhythm.     Pulses:          Radial pulses are 2+ on the right side and 2+ on the left side.     Heart sounds: Normal heart sounds.  Pulmonary:     Effort: Pulmonary effort is normal.     Breath sounds: Normal breath sounds.  Abdominal:     Palpations: Abdomen is soft.     Tenderness: There is no abdominal tenderness. There is no guarding or rebound.  Musculoskeletal:     Right lower leg: No edema.     Left lower leg: No edema.  Skin:     General: Skin is warm.     Capillary Refill: Capillary refill takes less than 2 seconds.     Comments: Left upper lip with well-circumscribed 4 mm cold sore on the vermilion border.  Lesion is scabbed over.  Small 2 cm abscess overlying the sternum, and there are no overlying skin changes.  The abscess is indurated with no fluctuance or drainage.  Neurological:     General: No focal deficit present.     Mental Status: She is alert and oriented to person, place, and time.     Comments: Alert and oriented x3.  Cranial nerves II through XII intact.  PERRLA, EOMI.  Strength grossly intact upper and lower extremities.  Negative Romberg, pronator drift.  Psychiatric:        Mood and Affect: Mood normal.        Behavior: Behavior normal.        Thought Content: Thought content normal.        Judgment: Judgment normal.     UC Treatments / Results  Labs (all labs ordered are listed, but only abnormal results are displayed) Labs Reviewed - No data to display  EKG   Radiology No results found.  Procedures Procedures (including critical care time)  Medications Ordered in UC Medications - No data to display  Initial Impression / Assessment and Plan / UC Course  I have reviewed the triage vital signs and the nursing notes.  Pertinent labs & imaging results that were available during my care of the patient were reviewed by me and considered in my medical decision making (see chart for details).     This patient is a very pleasant 56 y.o. year old female presenting with fatigue x4 days. Afebrile, nontachy. No SOB, CP, dizziness, weakness. Neurologically intact. She is followed closely by her neurologist and her primary care provider, last neurology appointment was 06/09/2021, last PCP appointment  was 05/19/2021.  She is using her CPAP as directed and taking her chronic medications as directed.Last A1c 7.6 on 05/19/2021, hemoglobin and hematocrit in normal range 01/04/2021.  She does incidentally  mention an abscess on the anterior chest wall, this is actually healing on its own, but she prefers to manage with antibiotics today so doxycycline sent.  Acyclovir cream sent for the cold sore left upper lip.  Follow-up with PCP for additional chronic concerns.  Final Clinical Impressions(s) / UC Diagnoses   Final diagnoses:  Cold sore  Iron deficiency anemia, unspecified iron deficiency anemia type  Other fatigue  Abscess of chest wall     Discharge Instructions      -Doxycycline twice daily for 7 days.  Make sure to wear sunscreen while spending time outside while on this medication as it can increase your chance of sunburn. You can take this medication with food if you have a sensitive stomach. -Acyclovir cream every 3 hours applied to cold sore until symptoms resolve -Follow-up with PCP if you continue to experience fatigue -Head straight to the ED if new symptoms like chest pain, shortness of breath, dizziness, weakness    ED Prescriptions     Medication Sig Dispense Auth. Provider   doxycycline (VIBRAMYCIN) 100 MG capsule Take 1 capsule (100 mg total) by mouth 2 (two) times daily for 7 days. 14 capsule Marin Roberts E, PA-C   acyclovir cream (ZOVIRAX) 5 % Apply 1 application. topically every 3 (three) hours for 7 days. 15 g Hazel Sams, PA-C      PDMP not reviewed this encounter.   Hazel Sams, PA-C 06/20/21 1527

## 2021-06-20 NOTE — Discharge Instructions (Addendum)
-  Doxycycline twice daily for 7 days.  Make sure to wear sunscreen while spending time outside while on this medication as it can increase your chance of sunburn. You can take this medication with food if you have a sensitive stomach. -Acyclovir cream every 3 hours applied to cold sore until symptoms resolve -Follow-up with PCP if you continue to experience fatigue -Head straight to the ED if new symptoms like chest pain, shortness of breath, dizziness, weakness

## 2021-09-01 ENCOUNTER — Ambulatory Visit
Admission: EM | Admit: 2021-09-01 | Discharge: 2021-09-01 | Disposition: A | Discharge status: Home / Self Care | Payer: Medicare Other | Attending: Emergency Medicine | Admitting: Emergency Medicine

## 2021-09-01 DIAGNOSIS — J029 Acute pharyngitis, unspecified: Secondary | ICD-10-CM | POA: Diagnosis not present

## 2021-09-01 DIAGNOSIS — Z20822 Contact with and (suspected) exposure to covid-19: Secondary | ICD-10-CM | POA: Diagnosis not present

## 2021-09-01 DIAGNOSIS — J069 Acute upper respiratory infection, unspecified: Secondary | ICD-10-CM | POA: Insufficient documentation

## 2021-09-01 DIAGNOSIS — R059 Cough, unspecified: Secondary | ICD-10-CM | POA: Diagnosis present

## 2021-09-01 DIAGNOSIS — R110 Nausea: Secondary | ICD-10-CM | POA: Diagnosis not present

## 2021-09-01 DIAGNOSIS — F17210 Nicotine dependence, cigarettes, uncomplicated: Secondary | ICD-10-CM | POA: Diagnosis not present

## 2021-09-01 LAB — SARS CORONAVIRUS 2 BY RT PCR: SARS Coronavirus 2 by RT PCR: NEGATIVE

## 2021-09-01 MED ORDER — IPRATROPIUM BROMIDE 0.06 % NA SOLN: 2.0000 | Freq: Four times a day (QID) | NASAL | 12 refills | Status: AC

## 2021-09-01 MED ORDER — BENZONATATE 100 MG PO CAPS: 200.0000 mg | ORAL_CAPSULE | Freq: Three times a day (TID) | ORAL | 0 refills | Status: AC

## 2021-09-01 MED ORDER — PROMETHAZINE-DM 6.25-15 MG/5ML PO SYRP: 5.0000 mL | ORAL_SOLUTION | Freq: Four times a day (QID) | ORAL | 0 refills | Status: AC | PRN

## 2021-09-01 NOTE — ED Triage Notes (Signed)
Patient to UC with complaints of cough and congestion that started yesterday. Reports cough is productive with clear sputum. States that her son is also sick with the same symptoms.

## 2021-09-01 NOTE — Discharge Instructions (Addendum)
Your COVID test today was negative.  I do believe you have a viral respiratory infection and your postnasal drip is what is causing the cough.  Use the Atrovent nasal spray, 2 squirts in each nostril every 6 hours, as needed for runny nose and postnasal drip.  Use the Tessalon Perles every 8 hours during the day.  Take them with a small sip of water.  They may give you some numbness to the base of your tongue or a metallic taste in your mouth, this is normal.  Use the Promethazine DM cough syrup at bedtime for cough and congestion.  It will make you drowsy so do not take it during the day.  Return for reevaluation or see your primary care provider for any new or worsening symptoms.

## 2021-09-01 NOTE — ED Provider Notes (Signed)
[facility]    CSN: [id number] Arrival date & time: 09/01/21  1345      History   Chief Complaint Chief Complaint  Patient presents with   Cough    HPI Rebecca Whitaker is a 56 y.o. female.   HPI  56 year old female here for evaluation of respiratory complaints.  Patient reports her symptoms began last night and they consist of nasal congestion, burning sore throat, burning in her chest, cough that is productive for clear sputum, body aches, and headache.  She denies any fever, wheezing, ear pain, vomiting, or diarrhea.  She does endorse some slight nausea.  She states her symptoms are very similar to those of her son.  She reports that her son was evaluated in the ER yesterday and was negative for COVID, flu, and RSV.  Past Medical History:  Diagnosis Date   Carpal tunnel syndrome    Depression    Diabetes mellitus without complication ([place])    Hypertension    Obesity    Sleep apnea     Patient Active Problem List   Diagnosis Date Noted   Bilateral leg pain 09/13/2018   Memory difficulties 09/13/2018   Sleep difficulties 09/13/2018   DUB (dysfunctional uterine bleeding) 08/10/2018   Low back pain 07/03/2018   Numbness and tingling of both feet 07/03/2018   Pulmonary nodules 04/10/2018   Tobacco abuse counseling 04/10/2018   Right hand pain 03/06/2018   Obesity (BMI 35.0-39.9 without comorbidity) 02/07/2018   Dyslipidemia 12/11/2017   Low ferritin 12/11/2017   Secondary polycythemia 08/18/2017   Vitamin D deficiency 08/18/2017   Hyperlipidemia due to type 2 diabetes mellitus ([place]) 11/20/2016   Multinodular goiter 11/20/2016   Vulvar lesion 08/26/2016   Type 2 diabetes mellitus with diabetic polyneuropathy, with long-term current use of insulin ([place]) 08/15/2016   Rib pain on left side 08/11/2016   Left upper quadrant pain 07/15/2016   Left arm pain 04/23/2016   MS (multiple sclerosis) ([place]) 04/23/2016   Numbness and tingling in right hand  04/23/2016   Pelvic pain in female 02/23/2016   History of depression 01/21/2016   Numbness and tingling 12/10/2015   Fibroids, subserous 07/21/2015   Diabetic autonomic neuropathy associated with type 2 diabetes mellitus ([place]) 12/03/2013   Depression 09/24/2013   Obesity, morbid, BMI 40.0-49.9 ([place]) 04/17/2013   Tobacco abuse 04/17/2013   Pain in extremity 03/27/2013   OSA (obstructive sleep apnea) 02/20/2013   Hypertension 10/15/2012   Primary open-angle glaucoma 04/27/2011   Bilateral carpal tunnel syndrome 11/02/2010   Hidradenitis 09/29/2010    Past Surgical History:  Procedure Laterality Date   CESAREAN SECTION  x 4   LAPAROSCOPIC UNILATERAL SALPINGO OOPHERECTOMY Right     OB History   No obstetric history on file.      Home Medications    Prior to Admission medications   Medication Sig Start Date End Date Taking? Authorizing Provider  benzonatate (TESSALON) 100 MG capsule Take 2 capsules (200 mg total) by mouth every 8 (eight) hours. 09/01/21  Yes Margarette Canada, NP  ipratropium (ATROVENT) 0.06 % nasal spray Place 2 sprays into both nostrils 4 (four) times daily. 09/01/21  Yes Margarette Canada, NP  promethazine-dextromethorphan (PROMETHAZINE-DM) 6.25-15 MG/5ML syrup Take 5 mLs by mouth 4 (four) times daily as needed. 09/01/21  Yes Margarette Canada, NP  acetaminophen (TYLENOL) 500 MG tablet Take 2 tablets (1,000 mg total) by mouth every 6 (six) hours as needed for moderate pain or fever. 11/03/14   Gerarda Fraction  A, NP  cyclobenzaprine (FLEXERIL) 10 MG tablet Take 10 mg by mouth 3 (three) times daily as needed for muscle spasms.    [provider]  DULoxetine (CYMBALTA) 30 MG capsule Take 30 mg by mouth daily. 05/19/21   [provider]  ezetimibe (ZETIA) 10 MG tablet Take 10 mg by mouth daily. 06/06/21   [provider]  glucose blood test strip 1 each by Other route as needed for other. Use as instructed    [provider]  hydrochlorothiazide  (HYDRODIURIL) 50 MG tablet Take 50 mg by mouth daily.    [provider]  insulin glargine (LANTUS) 100 UNIT/ML injection Inject into the skin at bedtime.    [provider]  latanoprost (XALATAN) 0.005 % ophthalmic solution 1 drop at bedtime.    [provider]  levocetirizine (XYZAL) 5 MG tablet Take 1 tablet by mouth every evening. 05/14/21 05/14/22  [provider]  losartan (COZAAR) 100 MG tablet Take 100 mg by mouth daily.    [provider]  metoprolol tartrate (LOPRESSOR) 25 MG tablet Take 25 mg by mouth daily.    [provider]  Semaglutide (OZEMPIC, 0.25 OR 0.5 MG/DOSE, [place]) Inject 0.5 mg into the skin.    [provider]  SYNJARDY XR 25-1000 MG TB24 Take 1 tablet by mouth daily. 06/06/21   [provider]  venlafaxine XR (EFFEXOR-XR) 75 MG 24 hr capsule Take 75 mg by mouth daily with breakfast.    [provider]    Family History History reviewed. No pertinent family history.  Social History Social History   Tobacco Use   Smoking status: Every Day    Packs/day: 1.00    Types: Cigarettes   Smokeless tobacco: Never  Vaping Use   Vaping Use: Never used  Substance Use Topics   Alcohol use: No   Drug use: No     Allergies   Clindamycin, Diphenhydramine hcl (sleep), Succinylcholine, Clindamycin/lincomycin, Diphenhydramine hcl, Gabapentin, Gentamycin [gentamicin], Ibuprofen, Lincomycin, Meperidine and related, Nsaids, Penicillins, Pertussis vaccines, Pravastatin, Propranolol, and Simvastatin   Review of Systems Review of Systems  Constitutional:  Negative for fever.  HENT:  Positive for congestion, rhinorrhea and sore throat. Negative for ear pain.   Respiratory:  Positive for cough. Negative for shortness of breath and wheezing.   Gastrointestinal:  Positive for nausea. Negative for diarrhea and vomiting.  Musculoskeletal:  Positive for arthralgias and myalgias.  Neurological:  Positive for  headaches.  Hematological: Negative.   Psychiatric/Behavioral: Negative.       Physical Exam Triage Vital Signs ED Triage Vitals  Enc Vitals Group     BP 09/01/21 1358 138/89     Pulse Rate 09/01/21 1358 98     Resp 09/01/21 1358 19     Temp 09/01/21 1358 98.5 F (36.9 C)     Temp Source 09/01/21 1358 Oral     SpO2 09/01/21 1358 97 %     Weight 09/01/21 1359 195 lb (88.5 kg)     Height 09/01/21 1359 '5\' 3"'$  (1.6 m)     Head Circumference --      Peak Flow --      Pain Score 09/01/21 1358 2     Pain Loc --      Pain Edu? --      Excl. in [place]? --    No data found.  Updated Vital Signs BP 138/89   Pulse 98   Temp 98.5 F (36.9 C) (Oral)   Resp  19   Ht '5\' 3"'$  (1.6 m)   Wt 195 lb (88.5 kg)   LMP 01/24/2018   SpO2 97%   BMI 34.54 kg/m   Visual Acuity Right Eye Distance:   Left Eye Distance:   Bilateral Distance:    Right Eye Near:   Left Eye Near:    Bilateral Near:     Physical Exam Vitals and nursing note reviewed.  Constitutional:      Appearance: Normal appearance. She is not ill-appearing.  HENT:     Head: Normocephalic and atraumatic.     Right Ear: Tympanic membrane, ear canal and external ear normal. There is no impacted cerumen.     Left Ear: Tympanic membrane, ear canal and external ear normal. There is no impacted cerumen.     Nose: Congestion and rhinorrhea present.     Mouth/Throat:     Mouth: Mucous membranes are moist.     Pharynx: Oropharynx is clear. Posterior oropharyngeal erythema present. No oropharyngeal exudate.  Cardiovascular:     Rate and Rhythm: Normal rate and regular rhythm.     Pulses: Normal pulses.     Heart sounds: Normal heart sounds. No murmur heard.    No friction rub. No gallop.  Pulmonary:     Effort: Pulmonary effort is normal.     Breath sounds: Normal breath sounds. No wheezing, rhonchi or rales.  Musculoskeletal:     Cervical back: Normal range of motion and neck supple.  Lymphadenopathy:     Cervical: No cervical  adenopathy.  Skin:    General: Skin is warm and dry.     Capillary Refill: Capillary refill takes less than 2 seconds.     Findings: No erythema or rash.  Neurological:     General: No focal deficit present.     Mental Status: She is alert and oriented to person, place, and time.  Psychiatric:        Mood and Affect: Mood normal.        Behavior: Behavior normal.        Thought Content: Thought content normal.        Judgment: Judgment normal.      UC Treatments / Results  Labs (all labs ordered are listed, but only abnormal results are displayed) Labs Reviewed  SARS CORONAVIRUS 2 BY RT PCR    EKG   Radiology No results found.  Procedures Procedures (including critical care time)  Medications Ordered in UC Medications - No data to display  Initial Impression / Assessment and Plan / UC Course  I have reviewed the triage vital signs and the nursing notes.  Pertinent labs & imaging results that were available during my care of the patient were reviewed by me and considered in my medical decision making (see chart for details).  Patient is a nontoxic-appearing 56 year old female here for evaluation of respiratory symptoms as outlined in HPI above.  On exam patient has pearly-gray tympanic membranes bilaterally with normal light reflex and clear external auditory canals.  Nasal mucosa is erythematous and edematous with clear discharge in both nares.  Oropharyngeal exam reveals posterior oropharyngeal erythema and injection with clear postnasal drip.  No cervical lymphadenopathy appreciated on exam.  Cardiopulmonary exam reveals S1-S2 heart sounds with regular rate and rhythm and lung sounds that are clear to auscultation in all fields.  Patient exam is consistent with a viral respiratory infection.  Will swab patient for COVID.  COVID PCR is negative.  I will discharge patient home with  a diagnosis of viral URI with cough.  I will prescribe Atrovent nasal spray to help with  nasal congestion and postnasal drip, Tessalon Perles, and Promethazine DM cough syrup.   Final Clinical Impressions(s) / UC Diagnoses   Final diagnoses:  Viral URI with cough     Discharge Instructions      Your COVID test today was negative.  I do believe you have a viral respiratory infection and your postnasal drip is what is causing the cough.  Use the Atrovent nasal spray, 2 squirts in each nostril every 6 hours, as needed for runny nose and postnasal drip.  Use the Tessalon Perles every 8 hours during the day.  Take them with a small sip of water.  They may give you some numbness to the base of your tongue or a metallic taste in your mouth, this is normal.  Use the Promethazine DM cough syrup at bedtime for cough and congestion.  It will make you drowsy so do not take it during the day.  Return for reevaluation or see your primary care provider for any new or worsening symptoms.      ED Prescriptions     Medication Sig Dispense Auth. Provider   benzonatate (TESSALON) 100 MG capsule Take 2 capsules (200 mg total) by mouth every 8 (eight) hours. 21 capsule Margarette Canada, NP   ipratropium (ATROVENT) 0.06 % nasal spray Place 2 sprays into both nostrils 4 (four) times daily. 15 mL Margarette Canada, NP   promethazine-dextromethorphan (PROMETHAZINE-DM) 6.25-15 MG/5ML syrup Take 5 mLs by mouth 4 (four) times daily as needed. 118 mL Margarette Canada, NP      PDMP not reviewed this encounter.   Margarette Canada, NP 09/01/21 1453

## 2022-04-15 IMAGING — CR DG CHEST 2V
2 series · 2 of 2 positions shown · non-contrast
Comparison: 06/29/2016.

CLINICAL DATA: And wheezing.  Abdominal tightness.  Diaphoresis.

EXAM:
CHEST - 2 VIEW

[chest pa]
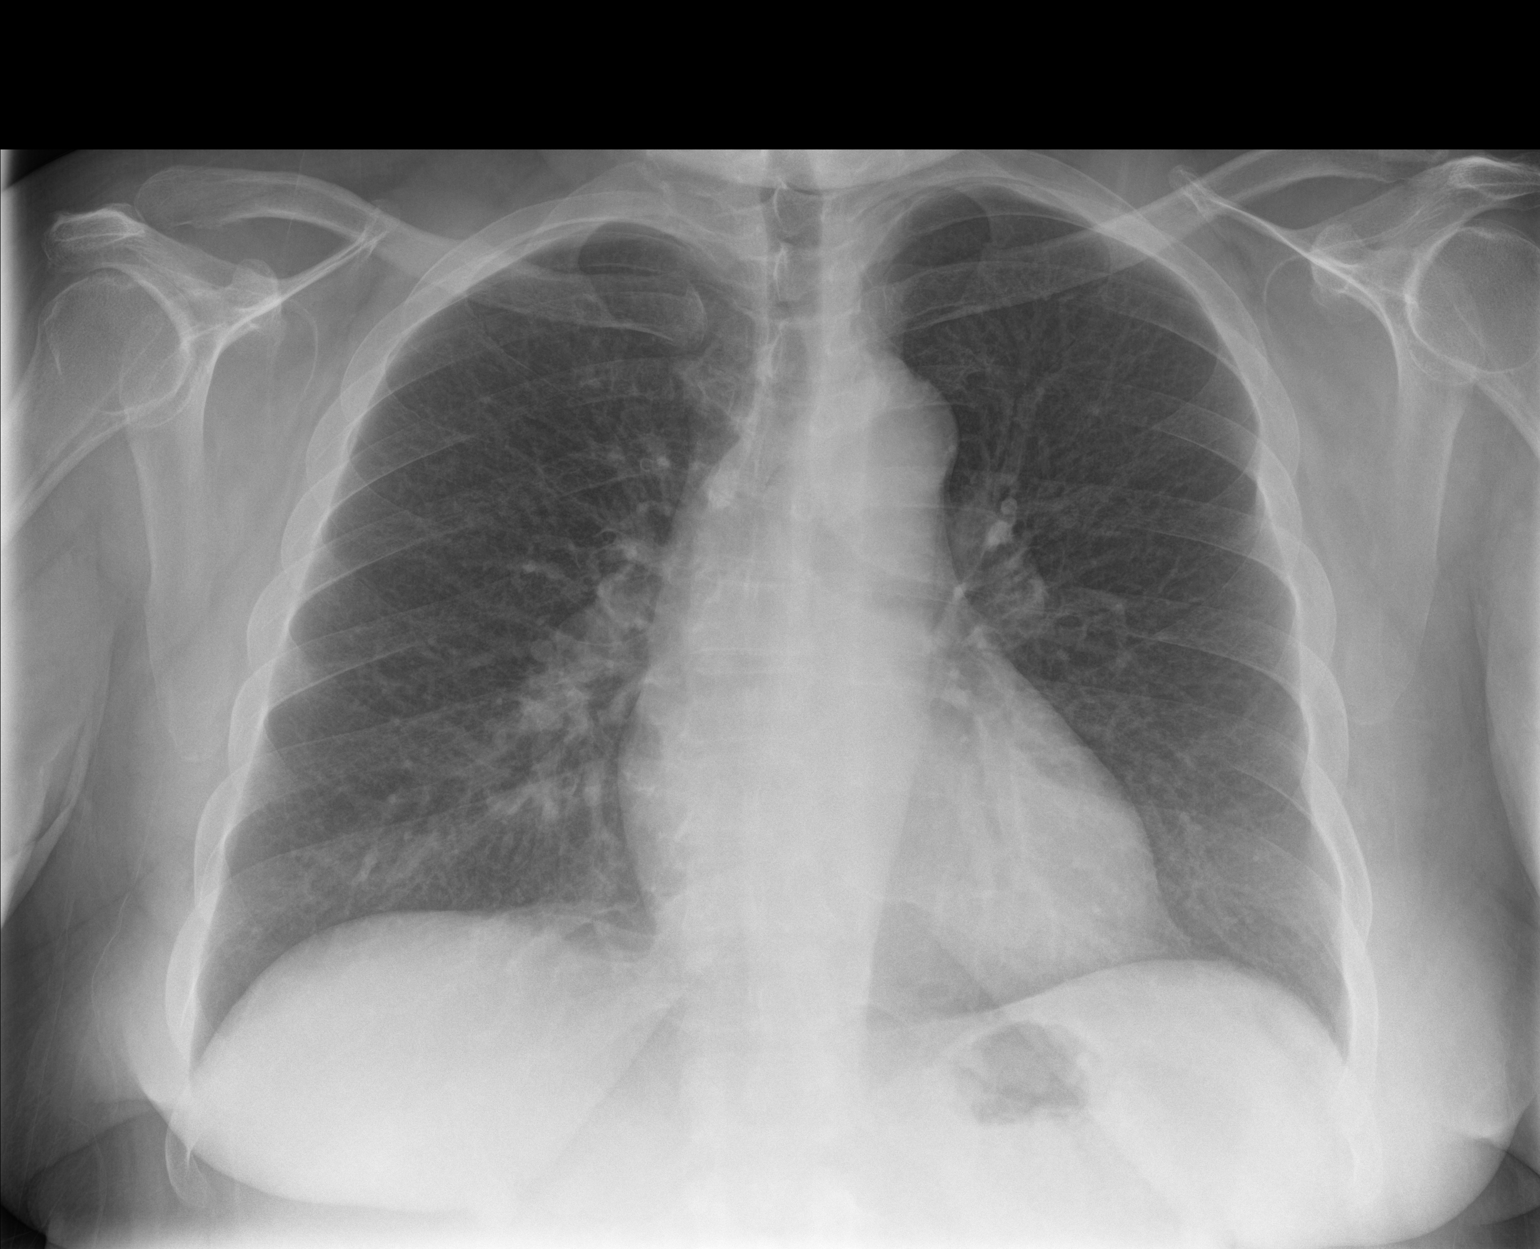

[chest lat]
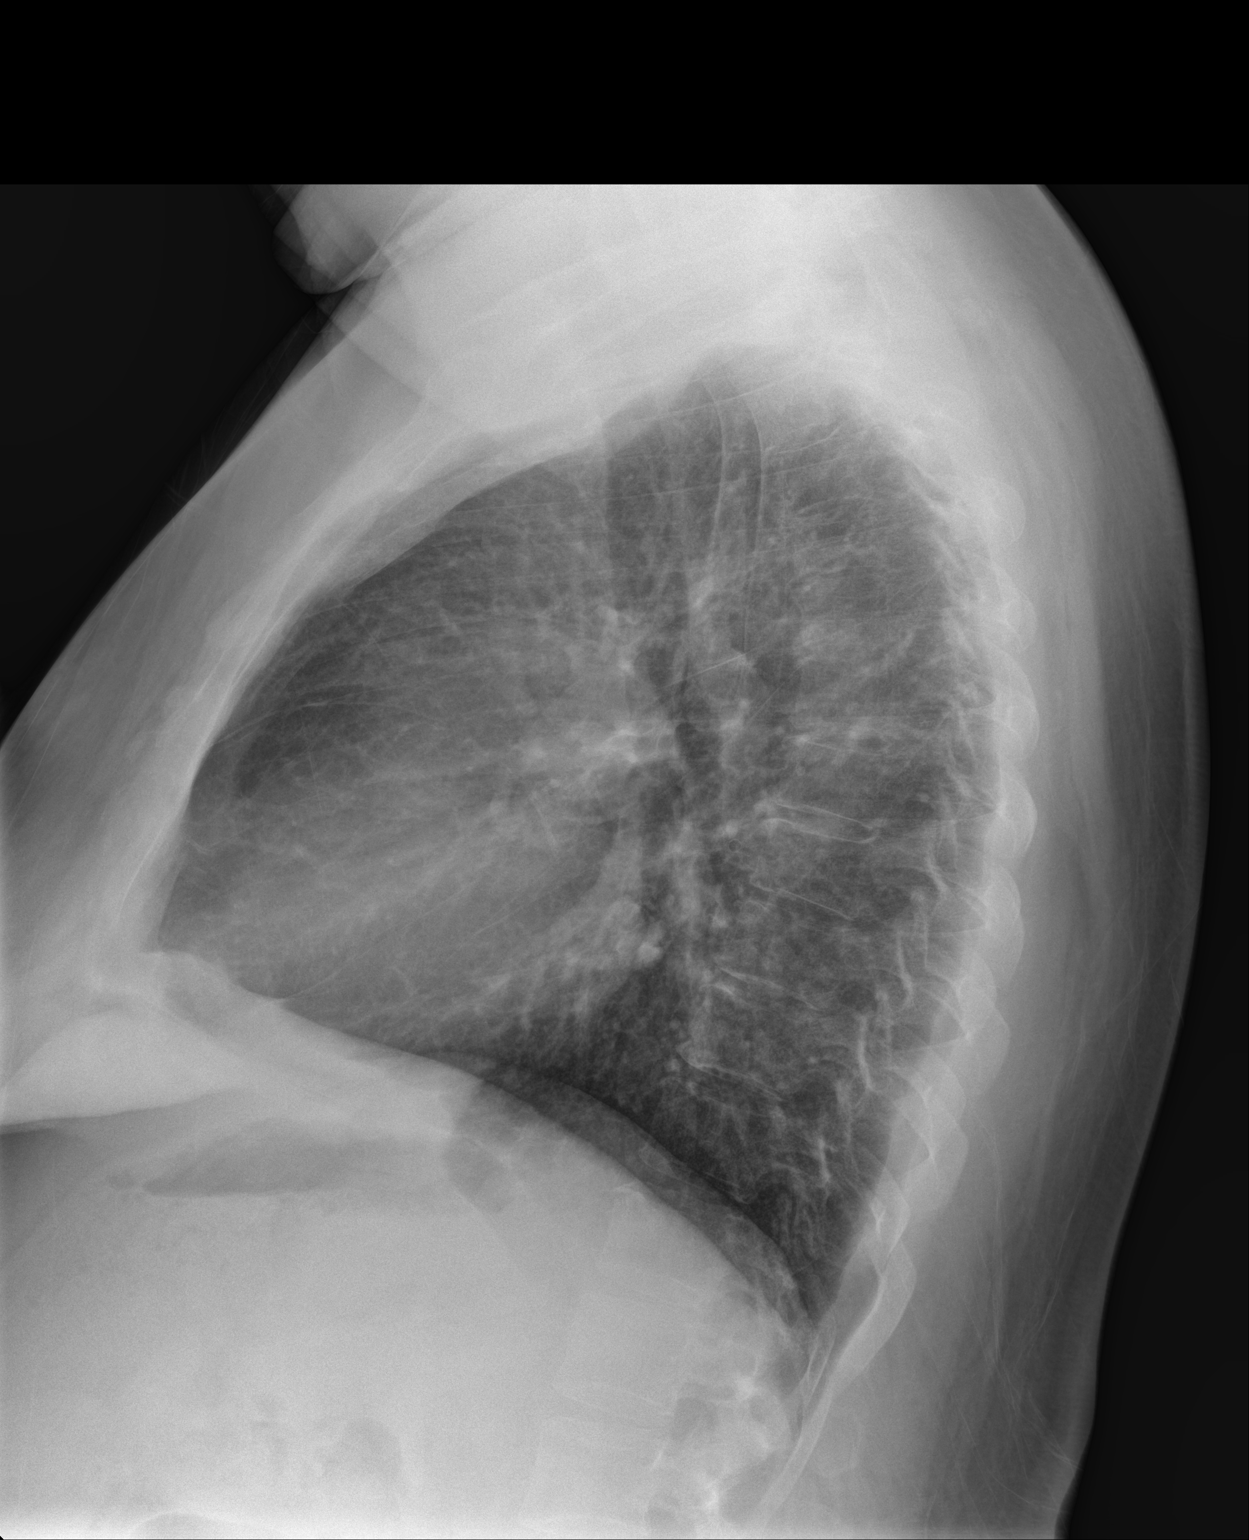

[2 of 2 positions shown; findings below may reference images not displayed]

FINDINGS: Trachea is midline. Heart size stable. Lungs are clear. No pleural
fluid.
IMPRESSION: No acute findings.

## 2022-09-12 ENCOUNTER — Ambulatory Visit (INDEPENDENT_AMBULATORY_CARE_PROVIDER_SITE_OTHER): Payer: 59

## 2022-09-12 ENCOUNTER — Ambulatory Visit
Admission: EM | Admit: 2022-09-12 | Discharge: 2022-09-12 | Disposition: A | Discharge status: Home / Self Care | Payer: 59 | Attending: Physician Assistant | Admitting: Physician Assistant

## 2022-09-12 ENCOUNTER — Encounter: Payer: Self-pay | Admitting: Emergency Medicine

## 2022-09-12 DIAGNOSIS — M545 Low back pain, unspecified: Secondary | ICD-10-CM

## 2022-09-12 DIAGNOSIS — G8929 Other chronic pain: Secondary | ICD-10-CM | POA: Diagnosis not present

## 2022-09-12 DIAGNOSIS — M25552 Pain in left hip: Secondary | ICD-10-CM

## 2022-09-12 MED ORDER — HYDROCODONE-ACETAMINOPHEN 5-325 MG PO TABS: 1.0000 | ORAL_TABLET | Freq: Four times a day (QID) | ORAL | 0 refills | Status: AC | PRN

## 2022-09-12 MED ORDER — PREDNISONE 10 MG PO TABS: ORAL_TABLET | ORAL | 0 refills | Status: AC

## 2022-09-12 MED ORDER — PREDNISONE 20 MG PO TABS: ORAL_TABLET | ORAL | 0 refills | Status: DC

## 2022-09-12 NOTE — ED Triage Notes (Signed)
Pt presents with left side hip pain x 2 days. Pt has tried OTC pain medication and heat for relief.

## 2022-09-12 NOTE — ED Provider Notes (Signed)
[facility]    CSN: [id number] Arrival date & time: 09/12/22  1042      History   Chief Complaint Chief Complaint  Patient presents with   Hip Pain    HPI Rebecca Whitaker is a 57 y.o. female with history of chronic back pain, hypertension, obesity, sleep apnea, diabetes, tobacco abuse, and hyperlipidemia.  Patient to start physical therapy for chronic back pain in a couple of days.   Patient reports over the past 2 days she has began to experience severe pain of the left lateral hip.  Pain radiates into the anterior thigh.  Pain is worse with position changes, walking/weightbearing, lying down, sitting.  Pain also worsened with movement of hip.  No pain radiating further down the leg.  Denies numbness/tingling or weakness.  Patient has not had sciatica related to chronic back pain before.  She has been taking Tylenol without any relief.  HPI  Past Medical History:  Diagnosis Date   Carpal tunnel syndrome    Depression    Diabetes mellitus without complication (HCC)    Hypertension    Obesity    Sleep apnea     Patient Active Problem List   Diagnosis Date Noted   Bilateral leg pain 09/13/2018   Memory difficulties 09/13/2018   Sleep difficulties 09/13/2018   DUB (dysfunctional uterine bleeding) 08/10/2018   Low back pain 07/03/2018   Numbness and tingling of both feet 07/03/2018   Pulmonary nodules 04/10/2018   Tobacco abuse counseling 04/10/2018   Right hand pain 03/06/2018   Obesity (BMI 35.0-39.9 without comorbidity) 02/07/2018   Dyslipidemia 12/11/2017   Low ferritin 12/11/2017   Secondary polycythemia 08/18/2017   Vitamin D deficiency 08/18/2017   Hyperlipidemia due to type 2 diabetes mellitus (HCC) 11/20/2016   Multinodular goiter 11/20/2016   Vulvar lesion 08/26/2016   Type 2 diabetes mellitus with diabetic polyneuropathy, with long-term current use of insulin (HCC) 08/15/2016   Rib pain on left side 08/11/2016   Left upper quadrant pain  07/15/2016   Left arm pain 04/23/2016   MS (multiple sclerosis) (HCC) 04/23/2016   Numbness and tingling in right hand 04/23/2016   Pelvic pain in female 02/23/2016   History of depression 01/21/2016   Numbness and tingling 12/10/2015   Fibroids, subserous 07/21/2015   Diabetic autonomic neuropathy associated with type 2 diabetes mellitus (HCC) 12/03/2013   Depression 09/24/2013   Obesity, morbid, BMI 40.0-49.9 (HCC) 04/17/2013   Tobacco abuse 04/17/2013   Pain in extremity 03/27/2013   OSA (obstructive sleep apnea) 02/20/2013   Hypertension 10/15/2012   Primary open-angle glaucoma 04/27/2011   Bilateral carpal tunnel syndrome 11/02/2010   Hidradenitis 09/29/2010    Past Surgical History:  Procedure Laterality Date   CESAREAN SECTION  x 4   LAPAROSCOPIC UNILATERAL SALPINGO OOPHERECTOMY Right     OB History   No obstetric history on file.      Home Medications    Prior to Admission medications   Medication Sig Start Date End Date Taking? Authorizing Provider  HYDROcodone-acetaminophen (NORCO) 5-325 MG tablet Take 1 tablet by mouth every 6 (six) hours as needed for up to 3 days for severe pain. 09/12/22 09/15/22 Yes Shirlee Latch, PA-C  acetaminophen (TYLENOL) 500 MG tablet Take 2 tablets (1,000 mg total) by mouth every 6 (six) hours as needed for moderate pain or fever. 11/03/14   Betancourt, Jarold Song, NP  benzonatate (TESSALON) 100 MG capsule Take 2 capsules (200 mg total) by mouth every 8 (eight)  hours. 09/01/21   Becky Augusta, NP  cyclobenzaprine (FLEXERIL) 10 MG tablet Take 10 mg by mouth 3 (three) times daily as needed for muscle spasms.    [provider]  DULoxetine (CYMBALTA) 30 MG capsule Take 30 mg by mouth daily. 05/19/21   [provider]  ezetimibe (ZETIA) 10 MG tablet Take 10 mg by mouth daily. 06/06/21   [provider]  glucose blood test strip 1 each by Other route as needed for other. Use as instructed    [provider]   hydrochlorothiazide (HYDRODIURIL) 50 MG tablet Take 50 mg by mouth daily.    [provider]  insulin glargine (LANTUS) 100 UNIT/ML injection Inject into the skin at bedtime.    [provider]  ipratropium (ATROVENT) 0.06 % nasal spray Place 2 sprays into both nostrils 4 (four) times daily. 09/01/21   Becky Augusta, NP  latanoprost (XALATAN) 0.005 % ophthalmic solution 1 drop at bedtime.    [provider]  levocetirizine (XYZAL) 5 MG tablet Take 1 tablet by mouth every evening. 05/14/21 05/14/22  [provider]  losartan (COZAAR) 100 MG tablet Take 100 mg by mouth daily.    [provider]  metoprolol tartrate (LOPRESSOR) 25 MG tablet Take 25 mg by mouth daily.    [provider]  predniSONE (DELTASONE) 10 MG tablet Take 6 tabs po on day 1 and decrease by 1 tab daily until complete 09/12/22   Shirlee Latch, PA-C  promethazine-dextromethorphan (PROMETHAZINE-DM) 6.25-15 MG/5ML syrup Take 5 mLs by mouth 4 (four) times daily as needed. 09/01/21   Becky Augusta, NP  Semaglutide (OZEMPIC, 0.25 OR 0.5 MG/DOSE, [place]) Inject 0.5 mg into the skin.    [provider]  SYNJARDY XR 25-1000 MG TB24 Take 1 tablet by mouth daily. 06/06/21   [provider]  venlafaxine XR (EFFEXOR-XR) 75 MG 24 hr capsule Take 75 mg by mouth daily with breakfast.    [provider]    Family History No family history on file.  Social History Social History   Tobacco Use   Smoking status: Every Day    Current packs/day: 1.00    Types: Cigarettes   Smokeless tobacco: Never  Vaping Use   Vaping status: Never Used  Substance Use Topics   Alcohol use: No   Drug use: No     Allergies   Clindamycin, Diphenhydramine hcl (sleep), Succinylcholine, Clindamycin/lincomycin, Diphenhydramine hcl, Gabapentin, Gentamycin [gentamicin], Ibuprofen, Lincomycin, Meperidine and related, Nsaids, Penicillins, Pertussis vaccines, Pravastatin, Propranolol, and  Simvastatin   Review of Systems Review of Systems  Gastrointestinal:  Negative for abdominal pain.  Genitourinary:  Negative for flank pain.  Musculoskeletal:  Positive for back pain (chronic back pain) and gait problem (L hip pain on weightbearing). Negative for joint swelling.  Skin:  Negative for color change and wound.  Neurological:  Negative for weakness and numbness.     Physical Exam Triage Vital Signs ED Triage Vitals  Encounter Vitals Group     BP 09/12/22 1116 114/78     Systolic BP Percentile --      Diastolic BP Percentile --      Pulse Rate 09/12/22 1116 90     Resp 09/12/22 1116 18     Temp 09/12/22 1116 98.7 F (37.1 C)     Temp src --      SpO2 09/12/22 1116 96 %     Weight --      Height --  Head Circumference --      Peak Flow --      Pain Score 09/12/22 1115 7     Pain Loc --      Pain Education --      Exclude from Growth Chart --    No data found.  Updated Vital Signs BP 114/78 (BP Location: Left Arm)   Pulse 90   Temp 98.7 F (37.1 C)   Resp 18   LMP 01/24/2018   SpO2 96%    Physical Exam Vitals and nursing note reviewed.  Constitutional:      General: She is not in acute distress.    Appearance: Normal appearance. She is not ill-appearing or toxic-appearing.  HENT:     Head: Normocephalic and atraumatic.  Eyes:     General: No scleral icterus.       Right eye: No discharge.        Left eye: No discharge.     Conjunctiva/sclera: Conjunctivae normal.  Cardiovascular:     Rate and Rhythm: Normal rate and regular rhythm.     Heart sounds: Normal heart sounds.  Pulmonary:     Effort: Pulmonary effort is normal. No respiratory distress.     Breath sounds: Normal breath sounds.  Musculoskeletal:     Cervical back: Neck supple.     Lumbar back: No tenderness. Normal range of motion. Negative right straight leg raise test and negative left straight leg raise test.     Left hip: Tenderness (lateral hip) present. No deformity.  Decreased range of motion.     Comments: Increased pain with internal and external rotation of the left hip.  Patient winces in pain when changing position from sitting to standing and when I move her hip during physical exam.  Skin:    General: Skin is dry.  Neurological:     General: No focal deficit present.     Mental Status: She is alert. Mental status is at baseline.     Motor: No weakness.     Gait: Gait abnormal.  Psychiatric:        Mood and Affect: Mood normal.        Behavior: Behavior normal.        Thought Content: Thought content normal.      UC Treatments / Results  Labs (all labs ordered are listed, but only abnormal results are displayed) Labs Reviewed - No data to display  EKG   Radiology DG Hip Unilat W or Wo Pelvis 2-3 Views Left  Result Date: 09/12/2022 CLINICAL DATA:  Left hip pain for 2 days. EXAM: DG HIP (WITH OR WITHOUT PELVIS) 2-3V LEFT COMPARISON:  Limited correlation made with radiographs of the abdomen 01/04/2021. FINDINGS: AP pelvis with AP and frog-leg lateral views of the left hip. The bones appear adequately mineralized. No evidence of acute fracture, dislocation or femoral head osteonecrosis. Mild degenerative changes of both hips. The sacroiliac joints appear unremarkable. The soft tissues appear unremarkable. IMPRESSION: Mild degenerative changes of both hips. No acute osseous findings. Electronically Signed   By: Carey Bullocks M.D.   On: 09/12/2022 12:22    Procedures Procedures (including critical care time)  Medications Ordered in UC Medications - No data to display  Initial Impression / Assessment and Plan / UC Course  I have reviewed the triage vital signs and the nursing notes.  Pertinent labs & imaging results that were available during my care of the patient were reviewed by me and considered in my medical  decision making (see chart for details).   57 year old female with history of chronic back pain, diabetes, hypertension,  hyperlipidemia, obesity, sleep apnea presents for 2-day history of left lateral hip pain with radiation to the left anterior thigh.  Denies history of hip arthritis or sciatica related to chronic back pain.  To start physical therapy for chronic back pain in 2 days.  Has been taking Tylenol for her hip pain without relief.  X-ray of left hip obtained today.  X-ray shows mild degenerative changes of both hips.  Reviewed results with patient.  Suspect her pain is likely related to pinched nerve in back since she is having such significant discomfort.  Patient cannot take NSAIDs for unknown reason.  She thinks it causes headaches but cannot be sure.  Sent prednisone to pharmacy.  Advised to monitor blood sugar.  May need to adjust insulin accordingly.  Also sent Norco as needed for severe pain.  Supportive care encouraged.  Reviewed RICE guidelines.  Advised follow-up with PCP.  Reviewed ED precautions.   Final Clinical Impressions(s) / UC Diagnoses   Final diagnoses:  Left hip pain  Chronic bilateral low back pain, unspecified whether sciatica present     Discharge Instructions      -X-ray shows mild arthritis in your hip. - Since you are having such significant pain it seems more likely that your pain is caused by a pinched nerve in your back. - Since you cannot take anti-inflammatory medication I sent prednisone taper to the pharmacy. I also sent pain medicine for severe pain. -May also use heat, ice, muscle rubs.  -Follow up with PCP.  BACK PAIN RED FLAGS: If the back pain acutely worsens or there are any red flag symptoms such as numbness/tingling, leg weakness, saddle anesthesia, or loss of bowel/bladder control, go immediately to the ER. Follow up with [place] as scheduled or sooner if the pain does not begin to resolve or if it worsens before the follow up       ED Prescriptions     Medication Sig Dispense Auth. Provider   predniSONE (DELTASONE) 20 MG tablet  (Status: Discontinued)  Take 6 tabs po on day 1 and decrease by 1 tab daily until complete 21 tablet Shirlee Latch, PA-C   HYDROcodone-acetaminophen (NORCO) 5-325 MG tablet Take 1 tablet by mouth every 6 (six) hours as needed for up to 3 days for severe pain. 12 tablet Eusebio Friendly B, PA-C   predniSONE (DELTASONE) 10 MG tablet Take 6 tabs po on day 1 and decrease by 1 tab daily until complete 21 tablet Shirlee Latch, PA-C      I have reviewed the PDMP during this encounter.   Shirlee Latch, PA-C 09/12/22 1240

## 2022-09-12 NOTE — Discharge Instructions (Addendum)
-  X-ray shows mild arthritis in your hip. - Since you are having such significant pain it seems more likely that your pain is caused by a pinched nerve in your back. - Since you cannot take anti-inflammatory medication I sent prednisone taper to the pharmacy. I also sent pain medicine for severe pain. -May also use heat, ice, muscle rubs.  -Follow up with PCP.  BACK PAIN RED FLAGS: If the back pain acutely worsens or there are any red flag symptoms such as numbness/tingling, leg weakness, saddle anesthesia, or loss of bowel/bladder control, go immediately to the ER. Follow up with [place] as scheduled or sooner if the pain does not begin to resolve or if it worsens before the follow up
